# Patient Record
Sex: Female | Born: 1995 | Race: Black or African American | Hispanic: No | Marital: Single | Smoking: Never smoker
Health system: Southern US, Community
[De-identification: ages and names within clinical notes are randomized; demographics above are authoritative.]

## PROBLEM LIST (undated history)

## (undated) ENCOUNTER — Inpatient Hospital Stay (HOSPITAL_COMMUNITY): Payer: Self-pay

## (undated) DIAGNOSIS — D649 Anemia, unspecified: Secondary | ICD-10-CM

## (undated) DIAGNOSIS — N83209 Unspecified ovarian cyst, unspecified side: Secondary | ICD-10-CM

## (undated) DIAGNOSIS — I10 Essential (primary) hypertension: Secondary | ICD-10-CM

## (undated) HISTORY — PX: NO PAST SURGERIES: SHX2092

## (undated) HISTORY — DX: Essential (primary) hypertension: I10

---
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
## 2014-01-01 MED ORDER — IBUPROFEN 800 MG PO TABS: 800.0000 mg | ORAL_TABLET | Freq: Once | ORAL | Status: DC

## 2014-01-01 NOTE — ED Notes (Signed)
Patient going upstairs with SANE nurse Elnita Maxwell(Cheryl). Family going with patient. A&Ox4. No acute distress noted at this time.

## 2016-03-06 ENCOUNTER — Emergency Department (HOSPITAL_COMMUNITY): Payer: Managed Care, Other (non HMO)

## 2016-03-06 ENCOUNTER — Emergency Department (HOSPITAL_COMMUNITY)
Admission: EM | Admit: 2016-03-06 | Discharge: 2016-03-06 | Disposition: A | Discharge status: Home / Self Care | Payer: Managed Care, Other (non HMO) | Attending: Emergency Medicine | Admitting: Emergency Medicine

## 2016-03-06 ENCOUNTER — Encounter (HOSPITAL_COMMUNITY): Payer: Self-pay | Admitting: *Deleted

## 2016-03-06 DIAGNOSIS — N83201 Unspecified ovarian cyst, right side: Secondary | ICD-10-CM | POA: Insufficient documentation

## 2016-03-06 DIAGNOSIS — R102 Pelvic and perineal pain: Secondary | ICD-10-CM

## 2016-03-06 DIAGNOSIS — R1031 Right lower quadrant pain: Secondary | ICD-10-CM | POA: Diagnosis present

## 2016-03-06 HISTORY — DX: Unspecified ovarian cyst, unspecified side: N83.209

## 2016-03-06 LAB — URINALYSIS, ROUTINE W REFLEX MICROSCOPIC
Bilirubin Urine: NEGATIVE
GLUCOSE, UA: NEGATIVE mg/dL
Hgb urine dipstick: NEGATIVE
Ketones, ur: NEGATIVE mg/dL
LEUKOCYTES UA: NEGATIVE
NITRITE: NEGATIVE
Protein, ur: NEGATIVE mg/dL
SPECIFIC GRAVITY, URINE: 1.022 (ref 1.005–1.030)
pH: 8 (ref 5.0–8.0)

## 2016-03-06 LAB — PREGNANCY, URINE: Preg Test, Ur: NEGATIVE

## 2016-03-06 MED ORDER — HYDROCODONE-ACETAMINOPHEN 5-325 MG PO TABS
1.0000 | ORAL_TABLET | Freq: Four times a day (QID) | ORAL | Status: DC | PRN
Start: 2016-03-06 — End: 2016-06-27

## 2016-03-06 MED ORDER — KETOROLAC TROMETHAMINE 60 MG/2ML IM SOLN
60.0000 mg | Freq: Once | INTRAMUSCULAR | Status: AC
  Administered 2016-03-06: 60 mg via INTRAMUSCULAR
  Filled 2016-03-06: qty 2

## 2016-03-06 NOTE — ED Notes (Signed)
Per EMS, pt complains of RLQ abd pain radiating down her groin for the past 2 days . Pt has hx of ovarian cysts. Pt states the pain is worse when urinating. Pt denies n/v/d.

## 2016-03-06 NOTE — ED Notes (Signed)
Pt out of dept will obtain labs when they return

## 2016-03-06 NOTE — ED Provider Notes (Signed)
CSN: [id number]     Arrival date & time 03/06/16  1433 History   First MD Initiated Contact with Patient 03/06/16 1455     Chief Complaint  Patient presents with  . Abdominal Pain     (Consider location/radiation/quality/duration/timing/severity/associated sxs/prior Treatment) Patient is a 20 y.o. female presenting with abdominal pain. The history is provided by the patient.  Abdominal Pain Pain location:  RLQ Pain quality: cramping, sharp and shooting   Pain radiates to:  Does not radiate Pain severity:  Severe Onset quality:  Sudden Duration:  3 hours Timing:  Constant Progression:  Unchanged Chronicity:  New (states had the same pain for a short time 2 days ago that resolved and then returned today after urinating) Context: not awakening from sleep, not medication withdrawal, not recent illness, not recent sexual activity, not sick contacts and not trauma   Relieved by:  None tried Worsened by:  Movement Ineffective treatments:  None tried Associated symptoms: nausea   Associated symptoms: no anorexia, no constipation, no cough, no diarrhea, no dysuria, no fever, no vaginal bleeding, no vaginal discharge and no vomiting   Risk factors: not pregnant and no recent hospitalization     Past Medical History  Diagnosis Date  . Ovarian cyst    History reviewed. No pertinent past surgical history. No family history on file. Social History  Substance Use Topics  . Smoking status: Never Smoker   . Smokeless tobacco: None  . Alcohol Use: None   OB History    No data available     Review of Systems  Constitutional: Negative for fever.  Respiratory: Negative for cough.   Gastrointestinal: Positive for nausea and abdominal pain. Negative for vomiting, diarrhea, constipation and anorexia.  Genitourinary: Negative for dysuria, vaginal bleeding and vaginal discharge.  All other systems reviewed and are negative.     Allergies  Review of patient's allergies indicates no  known allergies.  Home Medications   Prior to Admission medications   Not on File   BP 134/56 mmHg  Pulse 86  Temp(Src) 97.6 F (36.4 C) (Oral)  Resp 18  SpO2 100%  LMP 02/19/2016 Physical Exam  Constitutional: She is oriented to person, place, and time. She appears well-developed and well-nourished. No distress.  HENT:  Head: Normocephalic and atraumatic.  Mouth/Throat: Oropharynx is clear and moist.  Eyes: Conjunctivae and EOM are normal. Pupils are equal, round, and reactive to light.  Neck: Normal range of motion. Neck supple.  Cardiovascular: Normal rate, regular rhythm and intact distal pulses.   No murmur heard. Pulmonary/Chest: Effort normal and breath sounds normal. No respiratory distress. She has no wheezes. She has no rales.  Abdominal: Soft. She exhibits no distension. There is tenderness. There is guarding. There is no rebound and no CVA tenderness. No hernia.    Musculoskeletal: Normal range of motion. She exhibits no edema or tenderness.  Neurological: She is alert and oriented to person, place, and time.  Skin: Skin is warm and dry. No rash noted. No erythema.  Psychiatric: She has a normal mood and affect. Her behavior is normal.  Nursing note and vitals reviewed.   ED Course  Procedures (including critical care time) Labs Review Labs Reviewed  URINALYSIS, ROUTINE W REFLEX MICROSCOPIC (NOT AT [facility])  PREGNANCY, URINE    Imaging Review US Transvaginal Non-ob  03/06/2016  CLINICAL DATA:  Bilateral pelvic pain. EXAM: TRANSABDOMINAL AND TRANSVAGINAL ULTRASOUND OF PELVIS DOPPLER ULTRASOUND OF OVARIES TECHNIQUE: Both transabdominal and transvaginal ultrasound examinations of the pelvis  were performed. Transabdominal technique was performed for global imaging of the pelvis including uterus, ovaries, adnexal regions, and pelvic cul-de-sac. It was necessary to proceed with endovaginal exam following the transabdominal exam to visualize the uterus and ovaries. Color  and duplex Doppler ultrasound was utilized to evaluate blood flow to the ovaries. COMPARISON:  No recent. FINDINGS: Uterus Measurements: 7.3 x 4.1 x 4.8 cm. No fibroids or other mass visualized. Endometrium Thickness: 9 mm.  No focal abnormality visualized. Right ovary Measurements: 5.7 x 4.1 x 4.4 cm. 3.4 x 2.9 x 2.9 cm complex cyst right ovary. Left ovary Measurements: 3.0 x 1.5 x 1.7 cm. Normal appearance/no adnexal mass. Pulsed Doppler evaluation of both ovaries demonstrates normal low-resistance arterial and venous waveforms. Other findings No abnormal free fluid. IMPRESSION: 1. 3.4 x 2.9 x 2.9 cm complex cyst right ovary, possible hemorrhagic cyst. Short-interval follow up ultrasound in 6-12 weeks is recommended, preferably during the week following the patient's normal menses. 2.  Exam otherwise unremarkable.  No evidence of torsion. Electronically Signed   By: Maisie Fus  Register   On: 03/06/2016 16:27   US Pelvis Complete  03/06/2016  CLINICAL DATA:  Bilateral pelvic pain. EXAM: TRANSABDOMINAL AND TRANSVAGINAL ULTRASOUND OF PELVIS DOPPLER ULTRASOUND OF OVARIES TECHNIQUE: Both transabdominal and transvaginal ultrasound examinations of the pelvis were performed. Transabdominal technique was performed for global imaging of the pelvis including uterus, ovaries, adnexal regions, and pelvic cul-de-sac. It was necessary to proceed with endovaginal exam following the transabdominal exam to visualize the uterus and ovaries. Color and duplex Doppler ultrasound was utilized to evaluate blood flow to the ovaries. COMPARISON:  No recent. FINDINGS: Uterus Measurements: 7.3 x 4.1 x 4.8 cm. No fibroids or other mass visualized. Endometrium Thickness: 9 mm.  No focal abnormality visualized. Right ovary Measurements: 5.7 x 4.1 x 4.4 cm. 3.4 x 2.9 x 2.9 cm complex cyst right ovary. Left ovary Measurements: 3.0 x 1.5 x 1.7 cm. Normal appearance/no adnexal mass. Pulsed Doppler evaluation of both ovaries demonstrates normal  low-resistance arterial and venous waveforms. Other findings No abnormal free fluid. IMPRESSION: 1. 3.4 x 2.9 x 2.9 cm complex cyst right ovary, possible hemorrhagic cyst. Short-interval follow up ultrasound in 6-12 weeks is recommended, preferably during the week following the patient's normal menses. 2.  Exam otherwise unremarkable.  No evidence of torsion. Electronically Signed   By: Maisie Fus  Register   On: 03/06/2016 16:27   [place] Art/ven Flow Abd Pelv Doppler  03/06/2016  CLINICAL DATA:  Bilateral pelvic pain. EXAM: TRANSABDOMINAL AND TRANSVAGINAL ULTRASOUND OF PELVIS DOPPLER ULTRASOUND OF OVARIES TECHNIQUE: Both transabdominal and transvaginal ultrasound examinations of the pelvis were performed. Transabdominal technique was performed for global imaging of the pelvis including uterus, ovaries, adnexal regions, and pelvic cul-de-sac. It was necessary to proceed with endovaginal exam following the transabdominal exam to visualize the uterus and ovaries. Color and duplex Doppler ultrasound was utilized to evaluate blood flow to the ovaries. COMPARISON:  No recent. FINDINGS: Uterus Measurements: 7.3 x 4.1 x 4.8 cm. No fibroids or other mass visualized. Endometrium Thickness: 9 mm.  No focal abnormality visualized. Right ovary Measurements: 5.7 x 4.1 x 4.4 cm. 3.4 x 2.9 x 2.9 cm complex cyst right ovary. Left ovary Measurements: 3.0 x 1.5 x 1.7 cm. Normal appearance/no adnexal mass. Pulsed Doppler evaluation of both ovaries demonstrates normal low-resistance arterial and venous waveforms. Other findings No abnormal free fluid. IMPRESSION: 1. 3.4 x 2.9 x 2.9 cm complex cyst right ovary, possible hemorrhagic cyst. Short-interval follow up ultrasound in  6-12 weeks is recommended, preferably during the week following the patient's normal menses. 2.  Exam otherwise unremarkable.  No evidence of torsion. Electronically Signed   By: Maisie Fushomas  Register   On: 03/06/2016 16:27   I have personally reviewed and evaluated these  images and lab results as part of my medical decision-making.   EKG Interpretation None      MDM   Final diagnoses:  Pelvic pain in female  Right ovarian cyst    Patient is a 20 year old female with right pelvic pain. She had an episode 2 days ago which resolved after taking Aleve and then developed severe pain today after urinating. She denies any changes in her urination and denies any vaginal discharge. Nausea when the pain started but no vomiting. She has a prior history of ovarian cysts when she was younger but nothing currently.  She denies any back pain.   Patient symptoms are not suggestive of appendicitis however concern for ruptured ovarian cyst versus ovarian torsion. Abdomen other than the right pelvic area is nontender. No suspicion for cholecystitis, gastritis or pancreatitis.  Vital signs are within normal limits.  Patient was taken to ultrasound prior to the pelvic exam. Ultrasound shows a complex cyst in the right ovary possibly a hemorrhagic cyst. This is most likely the cause of her symptoms. When reevaluating to do a pelvic exam patient is refusing pelvic exam at this time. She once to follow-up with OB/GYN in 6-12 weeks to confirm that cyst has resolved. Patient given Toradol here and will ensure pain is controlled before going home. Pain improved and UPT neg.  Pt d/ced home.  Gwyneth SproutWhitney Edy Belt, MD 03/06/16 [phone]

## 2016-03-06 NOTE — ED Notes (Signed)
Pelvic cart set up at bedside  

## 2016-03-06 NOTE — ED Notes (Signed)
[facility] Expected date:  Expected time:  Means of arrival:  Comments: To room 12, abd pain

## 2016-04-13 ENCOUNTER — Encounter: Payer: Self-pay | Admitting: Obstetrics & Gynecology

## 2016-04-13 ENCOUNTER — Ambulatory Visit (INDEPENDENT_AMBULATORY_CARE_PROVIDER_SITE_OTHER): Payer: Managed Care, Other (non HMO) | Admitting: Obstetrics & Gynecology

## 2016-04-13 DIAGNOSIS — N83209 Unspecified ovarian cyst, unspecified side: Secondary | ICD-10-CM | POA: Insufficient documentation

## 2016-04-13 DIAGNOSIS — N83201 Unspecified ovarian cyst, right side: Secondary | ICD-10-CM

## 2016-04-13 NOTE — H&P (Signed)
   CLINIC ENCOUNTER NOTE  History:  20 y.o. G0P0000 here today for f/u pain for right ovarian cyst. Went to [facility] ED on 03/06/2016, complex cyst in right ovary with possible hemorrhage found on [place]. Today, she denies any abnormal vaginal discharge, bleeding, pelvic pain or other concerns. LMP approx.. 03/14/2016.  Past Medical History:  Diagnosis Date  . Ovarian cyst     No past surgical history on file.  The following portions of the patient's history were reviewed and updated as appropriate: allergies, current medications, past family history, past medical history, past social history, past surgical history and problem list.   Health Maintenance:  Last pap: none.  Last mammogram: N/A.   Review of Systems:  Review of Systems  Constitutional: Negative for chills and fever.  Gastrointestinal: Positive for nausea. Negative for abdominal pain, constipation and diarrhea.  Neurological: Negative for headaches.    Objective:  Physical Exam BP 140/75 (BP Location: Right Arm, Patient Position: Sitting, Cuff Size: Large)   Pulse 97   Ht 5\' 5"  (1.651 m)   Wt 262 lb 1.6 oz (118.9 kg)   LMP 03/14/2016 (Approximate)   BMI 43.62 kg/m  CONSTITUTIONAL: Well-developed, well-nourished female in no acute distress.  HENT:  Normocephalic, atraumatic. External right and left ear normal. Oropharynx is clear and moist EYES: Conjunctivae and EOM are normal. Pupils are equal, round, and reactive to light. No scleral icterus.  NECK: Normal range of motion, supple, no masses SKIN: Skin is warm and dry. No rash noted. Not diaphoretic. No erythema. No pallor. NEUROLOGIC: Alert and oriented to person, place, and time. Normal reflexes, muscle tone coordination. No cranial nerve deficit noted. PSYCHIATRIC: Normal mood and affect. Normal behavior. Normal judgment and thought content. CARDIOVASCULAR: Normal heart rate noted RESPIRATORY: Effort and breath sounds normal, no problems with respiration noted ABDOMEN:  Soft, no distention noted.   PELVIC: Deferred MUSCULOSKELETAL: Normal range of motion. No edema noted.  Labs and Imaging No results found.  Assessment & Plan:  1. Cyst of right ovary Pain from hemorraghic cyst likely related to ovulation. Pain has resolved. Schedule f/u imaging: - US Transvaginal Non-OB; Future - US Pelvis Complete; Future   Routine preventative health maintenance measures emphasized. Please refer to After Visit Summary for other counseling recommendations.   Return if symptoms worsen or fail to improve.   Total face-to-face time with patient: 30 minutes. Over 50% of encounter was spent on counseling and coordination of care.  Jaci Lazier, Medical Student

## 2016-04-13 NOTE — Patient Instructions (Signed)
Ovarian Cyst An ovarian cyst is a fluid-filled sac that forms on an ovary. The ovaries are small organs that produce eggs in women. Various types of cysts can form on the ovaries. Most are not cancerous. Many do not cause problems, and they often go away on their own. Some may cause symptoms and require treatment. Common types of ovarian cysts include:  Functional cysts--These cysts may occur every month during the menstrual cycle. This is normal. The cysts usually go away with the next menstrual cycle if the woman does not get pregnant. Usually, there are no symptoms with a functional cyst.  Endometrioma cysts--These cysts form from the tissue that lines the uterus. They are also called "chocolate cysts" because they become filled with blood that turns brown. This type of cyst can cause pain in the lower abdomen during intercourse and with your menstrual period.  Cystadenoma cysts--This type develops from the cells on the outside of the ovary. These cysts can get very big and cause lower abdomen pain and pain with intercourse. This type of cyst can twist on itself, cut off its blood supply, and cause severe pain. It can also easily rupture and cause a lot of pain.  Dermoid cysts--This type of cyst is sometimes found in both ovaries. These cysts may contain different kinds of body tissue, such as skin, teeth, hair, or cartilage. They usually do not cause symptoms unless they get very big.  Theca lutein cysts--These cysts occur when too much of a certain hormone (human chorionic gonadotropin) is produced and overstimulates the ovaries to produce an egg. This is most common after procedures used to assist with the conception of a baby (in vitro fertilization). CAUSES   Fertility drugs can cause a condition in which multiple large cysts are formed on the ovaries. This is called ovarian hyperstimulation syndrome.  A condition called polycystic ovary syndrome can cause hormonal imbalances that can lead to  nonfunctional ovarian cysts. SIGNS AND SYMPTOMS  Many ovarian cysts do not cause symptoms. If symptoms are present, they may include:  Pelvic pain or pressure.  Pain in the lower abdomen.  Pain during sexual intercourse.  Increasing girth (swelling) of the abdomen.  Abnormal menstrual periods.  Increasing pain with menstrual periods.  Stopping having menstrual periods without being pregnant. DIAGNOSIS  These cysts are commonly found during a routine or annual pelvic exam. Tests may be ordered to find out more about the cyst. These tests may include:  Ultrasound.  X-ray of the pelvis.  CT scan.  MRI.  Blood tests. TREATMENT  Many ovarian cysts go away on their own without treatment. Your health care provider may want to check your cyst regularly for 2-3 months to see if it changes. For women in menopause, it is particularly important to monitor a cyst closely because of the higher rate of ovarian cancer in menopausal women. When treatment is needed, it may include any of the following:  A procedure to drain the cyst (aspiration). This may be done using a long needle and ultrasound. It can also be done through a laparoscopic procedure. This involves using a thin, lighted tube with a tiny camera on the end (laparoscope) inserted through a small incision.  Surgery to remove the whole cyst. This may be done using laparoscopic surgery or an open surgery involving a larger incision in the lower abdomen.  Hormone treatment or birth control pills. These methods are sometimes used to help dissolve a cyst. HOME CARE INSTRUCTIONS   Only take over-the-counter   or prescription medicines as directed by your health care provider.  Follow up with your health care provider as directed.  Get regular pelvic exams and Pap tests. SEEK MEDICAL CARE IF:   Your periods are late, irregular, or painful, or they stop.  Your pelvic pain or abdominal pain does not go away.  Your abdomen becomes  larger or swollen.  You have pressure on your bladder or trouble emptying your bladder completely.  You have pain during sexual intercourse.  You have feelings of fullness, pressure, or discomfort in your stomach.  You lose weight for no apparent reason.  You feel generally ill.  You become constipated.  You lose your appetite.  You develop acne.  You have an increase in body and facial hair.  You are gaining weight, without changing your exercise and eating habits.  You think you are pregnant. SEEK IMMEDIATE MEDICAL CARE IF:   You have increasing abdominal pain.  You feel sick to your stomach (nauseous), and you throw up (vomit).  You develop a fever that comes on suddenly.  You have abdominal pain during a bowel movement.  Your menstrual periods become heavier than usual. MAKE SURE YOU:  Understand these instructions.  Will watch your condition.  Will get help right away if you are not doing well or get worse.   This information is not intended to replace advice given to you by your health care provider. Make sure you discuss any questions you have with your health care provider.   Document Released: 09/07/2005 Document Revised: 09/12/2013 Document Reviewed: 05/15/2013 Elsevier Interactive Patient Education 2016 [employer]  

## 2016-04-14 NOTE — Progress Notes (Signed)
Patient ID: Christine Mills, female   DOB: [birth year], 20 y.o.   MRN: [id number]  Chief Complaint  Patient presents with  . Follow-up  ED visit for pain, right ovarian cyst 03/11/16  HPI Christine Mills is a 20 y.o. female.  G0P0000 Patient's last menstrual period was 03/14/2016 (approximate). She presented to [facility] 5 weeks ago with lower abdominal pain which has resolved. She uses no contraception currently sexually active. US showed right ovarian cyst possibly hemorrhagic, 6-12 week f/u was recommended HPI  Past Medical History:  Diagnosis Date  . Ovarian cyst     No past surgical history on file.  No family history on file.  Social History Social History  Substance Use Topics  . Smoking status: Never Smoker  . Smokeless tobacco: Not on file  . Alcohol use Not on file    No Known Allergies  Current Outpatient Prescriptions  Medication Sig Dispense Refill  . HYDROcodone-acetaminophen (NORCO/VICODIN) 5-325 MG tablet Take 1-2 tablets by mouth every 6 (six) hours as needed for severe pain. (Patient not taking: Reported on 04/13/2016) 10 tablet 0   No current facility-administered medications for this visit.     Review of Systems Review of Systems  Constitutional: Negative.   Gastrointestinal: Negative.   Genitourinary: Negative for dyspareunia, menstrual problem, pelvic pain and vaginal bleeding.    Blood pressure 140/75, pulse 97, height 5\' 5"  (1.651 m), weight 118.9 kg (262 lb 1.6 oz), last menstrual period 03/14/2016.  Physical Exam Physical Exam  Constitutional: She is oriented to person, place, and time. She appears well-developed. No distress.  Pulmonary/Chest: Effort normal.  Abdominal: Soft. She exhibits no mass. There is no tenderness.  Neurological: She is alert and oriented to person, place, and time.  Psychiatric: She has a normal mood and affect. Her behavior is normal.    Data Reviewed CLINICAL DATA:  Bilateral pelvic pain.  EXAM: TRANSABDOMINAL  AND TRANSVAGINAL ULTRASOUND OF PELVIS  DOPPLER ULTRASOUND OF OVARIES  TECHNIQUE: Both transabdominal and transvaginal ultrasound examinations of the pelvis were performed. Transabdominal technique was performed for global imaging of the pelvis including uterus, ovaries, adnexal regions, and pelvic cul-de-sac.  It was necessary to proceed with endovaginal exam following the transabdominal exam to visualize the uterus and ovaries. Color and duplex Doppler ultrasound was utilized to evaluate blood flow to the ovaries.  COMPARISON:  No recent.  FINDINGS: Uterus  Measurements: 7.3 x 4.1 x 4.8 cm. No fibroids or other mass visualized.  Endometrium  Thickness: 9 mm.  No focal abnormality visualized.  Right ovary  Measurements: 5.7 x 4.1 x 4.4 cm. 3.4 x 2.9 x 2.9 cm complex cyst right ovary.  Left ovary  Measurements: 3.0 x 1.5 x 1.7 cm. Normal appearance/no adnexal mass.  Pulsed Doppler evaluation of both ovaries demonstrates normal low-resistance arterial and venous waveforms.  Other findings  No abnormal free fluid.  IMPRESSION: 1. 3.4 x 2.9 x 2.9 cm complex cyst right ovary, possible hemorrhagic cyst. Short-interval follow up ultrasound in 6-12 weeks is recommended, preferably during the week following the patient's normal menses.  2.  Exam otherwise unremarkable.  No evidence of torsion.   Electronically Signed   By: Maisie Fus  Register   On: 03/06/2016 16:27  Assessment    Recent episode of pelvic pain Suspect hemorrhagic ovarian cyst, currently asymptomatic    Plan    F/U US in 3 weeks and notify her of result Recommend she consider contraceptive method       Christine Mills 04/14/2016, 9:08 AM

## 2016-04-28 ENCOUNTER — Ambulatory Visit (HOSPITAL_COMMUNITY)
Admission: RE | Admit: 2016-04-28 | Discharge: 2016-04-28 | Disposition: A | Discharge status: Home / Self Care | Payer: Managed Care, Other (non HMO) | Source: Ambulatory Visit | Attending: Obstetrics & Gynecology | Admitting: Obstetrics & Gynecology

## 2016-04-28 DIAGNOSIS — N83201 Unspecified ovarian cyst, right side: Secondary | ICD-10-CM | POA: Diagnosis present

## 2016-04-30 ENCOUNTER — Telehealth: Payer: Self-pay

## 2016-04-30 ENCOUNTER — Telehealth: Payer: Self-pay | Admitting: General Practice

## 2016-04-30 NOTE — Telephone Encounter (Signed)
Per Dr Debroah LoopArnold, patient's ultrasound is normal. Called patient & informed her of results. Patient verbalized understanding & asked when you normally ovulate. Told patient it is approximately 2 weeks after your period starts. Patient verbalized understanding & asked if she could get a cyst again. Told patient yes as they are quite common. Patient verbalized understanding & had no questions

## 2016-04-30 NOTE — Telephone Encounter (Signed)
-----   Message from Adam PhenixJames G Arnold, MD sent at 04/29/2016  9:25 AM EDT ----- Normal US no cysts seen, routine f/u

## 2016-06-25 ENCOUNTER — Encounter (HOSPITAL_COMMUNITY): Payer: Self-pay | Admitting: *Deleted

## 2016-06-25 ENCOUNTER — Emergency Department (HOSPITAL_COMMUNITY)
Admission: EM | Admit: 2016-06-25 | Discharge: 2016-06-25 | Disposition: A | Discharge status: Home / Self Care | Payer: Managed Care, Other (non HMO) | Attending: Emergency Medicine | Admitting: Emergency Medicine

## 2016-06-25 ENCOUNTER — Emergency Department (HOSPITAL_COMMUNITY): Payer: Managed Care, Other (non HMO)

## 2016-06-25 DIAGNOSIS — O0281 Inappropriate change in quantitative human chorionic gonadotropin (hCG) in early pregnancy: Secondary | ICD-10-CM | POA: Diagnosis not present

## 2016-06-25 DIAGNOSIS — Z3A01 Less than 8 weeks gestation of pregnancy: Secondary | ICD-10-CM | POA: Insufficient documentation

## 2016-06-25 DIAGNOSIS — O209 Hemorrhage in early pregnancy, unspecified: Secondary | ICD-10-CM

## 2016-06-25 LAB — CBC WITH DIFFERENTIAL/PLATELET
BASOS ABS: 0 10*3/uL (ref 0.0–0.1)
BASOS PCT: 0 %
EOS ABS: 0.2 10*3/uL (ref 0.0–0.7)
EOS PCT: 2 %
HCT: 33.7 % — ABNORMAL LOW (ref 36.0–46.0)
Hemoglobin: 10.4 g/dL — ABNORMAL LOW (ref 12.0–15.0)
LYMPHS ABS: 3.6 10*3/uL (ref 0.7–4.0)
Lymphocytes Relative: 43 %
MCH: 25.5 pg — ABNORMAL LOW (ref 26.0–34.0)
MCHC: 30.9 g/dL (ref 30.0–36.0)
MCV: 82.6 fL (ref 78.0–100.0)
MONO ABS: 0.4 10*3/uL (ref 0.1–1.0)
Monocytes Relative: 5 %
Neutro Abs: 4.3 10*3/uL (ref 1.7–7.7)
Neutrophils Relative %: 50 %
PLATELETS: 366 10*3/uL (ref 150–400)
RBC: 4.08 MIL/uL (ref 3.87–5.11)
RDW: 15.5 % (ref 11.5–15.5)
WBC: 8.5 10*3/uL (ref 4.0–10.5)

## 2016-06-25 LAB — COMPREHENSIVE METABOLIC PANEL
ALK PHOS: 64 U/L (ref 38–126)
ALT: 15 U/L (ref 14–54)
AST: 16 U/L (ref 15–41)
Albumin: 3.7 g/dL (ref 3.5–5.0)
Anion gap: 8 (ref 5–15)
BUN: 9 mg/dL (ref 6–20)
CHLORIDE: 104 mmol/L (ref 101–111)
CO2: 23 mmol/L (ref 22–32)
CREATININE: 0.59 mg/dL (ref 0.44–1.00)
Calcium: 9.5 mg/dL (ref 8.9–10.3)
GFR calc Af Amer: >60 mL/min (ref >60)
GFR calc non Af Amer: >60 mL/min (ref >60)
Glucose, Bld: 98 mg/dL (ref 65–99)
Potassium: 3.9 mmol/L (ref 3.5–5.1)
Sodium: 135 mmol/L (ref 135–145)
Total Bilirubin: 0.3 mg/dL (ref 0.3–1.2)
Total Protein: 6.9 g/dL (ref 6.5–8.1)

## 2016-06-25 LAB — URINALYSIS, ROUTINE W REFLEX MICROSCOPIC
BILIRUBIN URINE: NEGATIVE
Glucose, UA: NEGATIVE mg/dL
Ketones, ur: NEGATIVE mg/dL
Leukocytes, UA: NEGATIVE
NITRITE: NEGATIVE
Protein, ur: NEGATIVE mg/dL
SPECIFIC GRAVITY, URINE: 1.009 (ref 1.005–1.030)
pH: 7 (ref 5.0–8.0)

## 2016-06-25 LAB — TYPE AND SCREEN
ABO/RH(D): O POS
ANTIBODY SCREEN: NEGATIVE

## 2016-06-25 LAB — I-STAT BETA HCG BLOOD, ED (MC, WL, AP ONLY): HCG, QUANTITATIVE: 146.7 m[IU]/mL — AB (ref <5)

## 2016-06-25 LAB — URINE MICROSCOPIC-ADD ON: Bacteria, UA: NONE SEEN

## 2016-06-25 LAB — GC/CHLAMYDIA PROBE AMP (~~LOC~~) NOT AT ARMC
Chlamydia: NEGATIVE
Neisseria Gonorrhea: NEGATIVE

## 2016-06-25 LAB — ABO/RH: ABO/RH(D): O POS

## 2016-06-25 LAB — HIV ANTIBODY (ROUTINE TESTING W REFLEX): HIV SCREEN 4TH GENERATION: NONREACTIVE

## 2016-06-25 LAB — RPR: RPR: NONREACTIVE

## 2016-06-25 NOTE — Discharge Instructions (Signed)
Avoid exertion.  No sexual intercourse.  Take ibuprofen or Tylenol for pain.

## 2016-06-25 NOTE — ED Triage Notes (Signed)
Pt states she is pregnant, unknown how far along. Pt reports some spotting and a clot around 1400 today. Pt passed only one clot.

## 2016-06-25 NOTE — ED Provider Notes (Signed)
[facility]-EMERGENCY DEPT Provider Note   CSN: [id number] Arrival date & time: 06/25/16  0012  By signing my name below, I, Christine Mills, attest that this documentation has been prepared under the direction and in the presence of Christine BaleElliott Benicia Bergevin, MD. Electronically Signed: Angelene GiovanniEmmanuella Mills, ED Scribe. 06/25/16. 3:29 AM.    History   Chief Complaint Chief Complaint  Patient presents with  . Routine Prenatal Visit    HPI Comments: Christine Mills is a 20 y.o. female with a hx of ovarian cyst who presents to the Emergency Department requesting evaluation s/p recently finding out that she is pregnant. Pt is not sure how far along she is but reports that her LNMP was on 05/11/16. She reports associated suprapubic pain she describes as heaviness and vaginal bleeding with spotting onset 2 pm yesterday. No alleviating factors noted. Pt has not tried any medications PTA. She states that she took 2 home pregnancy tests and went to confirm with a blood test at a lab center. Pt has NKDA.  She denies any fever, nausea, vomiting, or any other complaints.   The history is provided by the patient. No language interpreter was used.    Past Medical History:  Diagnosis Date  . Ovarian cyst     Patient Active Problem List   Diagnosis Date Noted  . Ovarian cyst 04/13/2016    History reviewed. No pertinent surgical history.  OB History    Gravida Para Term Preterm AB Living   1 0 0 0 0 0   SAB TAB Ectopic Multiple Live Births   0 0 0 0 0       Home Medications    Prior to Admission medications   Medication Sig Start Date End Date Taking? Authorizing Provider  Prenatal Vit-Fe Fumarate-FA (PRENATAL MULTIVITAMIN) TABS tablet Take 1 tablet by mouth daily at 12 noon.   Yes Historical Provider, MD  HYDROcodone-acetaminophen (NORCO/VICODIN) 5-325 MG tablet Take 1-2 tablets by mouth every 6 (six) hours as needed for severe pain. Patient not taking: Reported on 06/25/2016 03/06/16   [name],  MD    Family History History reviewed. No pertinent family history.  Social History Social History  Substance Use Topics  . Smoking status: Never Smoker  . Smokeless tobacco: Never Used  . Alcohol use Yes     Allergies   Review of patient's allergies indicates no known allergies.   Review of Systems Review of Systems  Constitutional: Negative for fever.  Gastrointestinal: Positive for abdominal pain. Negative for nausea and vomiting.  Genitourinary: Positive for vaginal bleeding.  All other systems reviewed and are negative.    Physical Exam Updated Vital Signs BP (!) 137/48 (BP Location: Right Arm)   Pulse 102   Temp 98 F (36.7 C) (Oral)   Resp 18   Ht 5\' 5"  (1.651 m)   Wt 250 lb (113.4 kg)   LMP 05/18/2016   SpO2 100%   BMI 41.60 kg/m   Physical Exam  Constitutional: She is oriented to person, place, and time. She appears well-developed and well-nourished. No distress.  HENT:  Head: Normocephalic and atraumatic.  Eyes: Conjunctivae and EOM are normal.  Neck: Neck supple. No tracheal deviation present.  Cardiovascular: Normal rate.   Pulmonary/Chest: Effort normal. No respiratory distress.  Genitourinary:  Genitourinary Comments: Normal external female genitalia. Blood and vaginal wall, that appears to be coming from the cervical os. Cervix is closed. On bimanual examination there is no uterine enlargement, adnexal tenderness or palpable adnexal mass.  Musculoskeletal:  Normal range of motion.  Neurological: She is alert and oriented to person, place, and time.  Skin: Skin is warm and dry.  Psychiatric: She has a normal mood and affect. Her behavior is normal.  Nursing note and vitals reviewed.    ED Treatments / Results  DIAGNOSTIC STUDIES: Oxygen Saturation is 99% on RA, normal by my interpretation.    COORDINATION OF CARE: 3:25 AM- Pt advised of plan for treatment and pt agrees. Pt will receive pelvic examination for further evaluation.     Labs (all labs ordered are listed, but only abnormal results are displayed) Labs Reviewed  CBC WITH DIFFERENTIAL/PLATELET - Abnormal; Notable for the following:       Result Value   Hemoglobin 10.4 (*)    HCT 33.7 (*)    MCH 25.5 (*)    All other components within normal limits  URINALYSIS, ROUTINE W REFLEX MICROSCOPIC (NOT AT [facility]) - Abnormal; Notable for the following:    Hgb urine dipstick LARGE (*)    All other components within normal limits  URINE MICROSCOPIC-ADD ON - Abnormal; Notable for the following:    Squamous Epithelial / LPF 0-5 (*)    All other components within normal limits  I-STAT BETA HCG BLOOD, ED ([facility], [facility], [facility] ONLY) - Abnormal; Notable for the following:    I-stat hCG, quantitative 146.7 (*)    All other components within normal limits  COMPREHENSIVE METABOLIC PANEL  RPR  HIV ANTIBODY (ROUTINE TESTING)  TYPE AND SCREEN  ABO/RH  GC/CHLAMYDIA PROBE AMP ([place]) NOT AT [facility]    EKG  EKG Interpretation None       Radiology US Ob Comp Less 14 Wks  Result Date: 06/25/2016 CLINICAL DATA:  Bleeding and cramping during pregnancy. Estimated gestational age by LMP is 20 years 0 months. Quantitative beta HCG is 146.7. This is rising from 24 on 06/17/2017 according to the patient. EXAM: OBSTETRIC <14 WK [place] AND TRANSVAGINAL OB US TECHNIQUE: Both transabdominal and transvaginal ultrasound examinations were performed for complete evaluation of the gestation as well as the maternal uterus, adnexal regions, and pelvic cul-de-sac. Transvaginal technique was performed to assess early pregnancy. COMPARISON:  Pelvic ultrasound 04/28/2016 FINDINGS: Intrauterine gestational sac: No intrauterine gestational sac identified. Yolk sac:  Not identified. Embryo:  Not identified. Cardiac Activity: Not identified. Maternal uterus/adnexae: Uterus is anteverted. No myometrial mass lesions identified. Endometrial stripe thickness measures 10 mm. Endometrium is mildly heterogeneous. No fluid  collections are demonstrated. Both ovaries are visualized and appear normal. No abnormal adnexal mass lesions. No free fluid in the pelvis. IMPRESSION: No intrauterine gestational sac, yolk sac, or fetal pole identified. Differential considerations include intrauterine pregnancy too early to be sonographically visualized, missed abortion, or ectopic pregnancy. Followup ultrasound is recommended in 10-14 days for further evaluation. Electronically Signed   By: Burman Nieves M.D.   On: 06/25/2016 05:35   US Ob Transvaginal  Result Date: 06/25/2016 CLINICAL DATA:  Bleeding and cramping during pregnancy. Estimated gestational age by LMP is 20 years 0 months. Quantitative beta HCG is 146.7. This is rising from 24 on 06/17/2017 according to the patient. EXAM: OBSTETRIC <14 WK [place] AND TRANSVAGINAL OB US TECHNIQUE: Both transabdominal and transvaginal ultrasound examinations were performed for complete evaluation of the gestation as well as the maternal uterus, adnexal regions, and pelvic cul-de-sac. Transvaginal technique was performed to assess early pregnancy. COMPARISON:  Pelvic ultrasound 04/28/2016 FINDINGS: Intrauterine gestational sac: No intrauterine gestational sac identified. Yolk sac:  Not identified. Embryo:  Not  identified. Cardiac Activity: Not identified. Maternal uterus/adnexae: Uterus is anteverted. No myometrial mass lesions identified. Endometrial stripe thickness measures 10 mm. Endometrium is mildly heterogeneous. No fluid collections are demonstrated. Both ovaries are visualized and appear normal. No abnormal adnexal mass lesions. No free fluid in the pelvis. IMPRESSION: No intrauterine gestational sac, yolk sac, or fetal pole identified. Differential considerations include intrauterine pregnancy too early to be sonographically visualized, missed abortion, or ectopic pregnancy. Followup ultrasound is recommended in 10-14 days for further evaluation. Electronically Signed   By: Burman Nieves M.D.    On: 06/25/2016 05:35    Procedures Procedures (including critical care time)  Medications Ordered in ED Medications - No data to display   Initial Impression / Assessment and Plan / ED Course  Christine Bale, MD has reviewed the triage vital signs and the nursing notes.  Pertinent labs & imaging results that were available during my care of the patient were reviewed by me and considered in my medical decision making (see chart for details).  Clinical Course    Medications - No data to display  Patient Vitals for the past 24 hrs:  BP Temp Temp src Pulse Resp SpO2 Height Weight  06/25/16 0726 (!) 137/48 - - 102 18 100 % - -  06/25/16 0415 141/82 - - 84 - 100 % - -  06/25/16 0400 148/79 - - 94 - 100 % - -  06/25/16 0205 127/82 98 F (36.7 C) Oral 93 19 99 % - -  06/25/16 0022 143/92 99 F (37.2 C) Oral 101 18 97 % 5\' 5"  (1.651 m) 250 lb (113.4 kg)   Discussed with on-call gynecology, Dr. Emelda Fear. He agrees with discharge with outpatient follow-up for repeat quantitative hCG testing in 2 days time  At D/C Reevaluation with update and discussion. After initial assessment and treatment, an updated evaluation reveals she is comfortable. Findings discussed with the patient and all questions were answered. Katilyn Miltenberger L    Final Clinical Impressions(s) / ED Diagnoses   Final diagnoses:  Less than [redacted] weeks gestation of pregnancy  Vaginal bleeding before [redacted] weeks gestation    Early pregnancy, with very low quantitative hCG. Vaginal bleeding, blood type O positive. Suspect miscarriage. Doubt ectopic pregnancy.  Nursing Notes Reviewed/ Care Coordinated Applicable Imaging Reviewed Interpretation of Laboratory Data incorporated into ED treatment  The patient appears reasonably screened and/or stabilized for discharge and I doubt any other medical condition or other [facility] requiring further screening, evaluation, or treatment in the ED at this time prior to discharge.  Plan: Home  Medications- none; Home Treatments- rest, no sex; return here if the recommended treatment, does not improve the symptoms; Recommended follow up- MAU in 2 days for check up   New Prescriptions Discharge Medication List as of 06/25/2016  7:06 AM     I personally performed the services described in this documentation, which was scribed in my presence. The recorded information has been reviewed and is accurate.     Christine Bale, MD 06/25/16 [phone]

## 2016-06-25 NOTE — ED Notes (Signed)
Patient transported to Ultrasound 

## 2016-06-27 ENCOUNTER — Encounter (HOSPITAL_COMMUNITY): Payer: Self-pay

## 2016-06-27 ENCOUNTER — Inpatient Hospital Stay (HOSPITAL_COMMUNITY)
Admission: AD | Admit: 2016-06-27 | Discharge: 2016-06-27 | Disposition: A | Discharge status: Home / Self Care | Payer: Managed Care, Other (non HMO) | Source: Ambulatory Visit | Attending: Obstetrics and Gynecology | Admitting: Obstetrics and Gynecology

## 2016-06-27 DIAGNOSIS — Z3A01 Less than 8 weeks gestation of pregnancy: Secondary | ICD-10-CM | POA: Insufficient documentation

## 2016-06-27 DIAGNOSIS — O039 Complete or unspecified spontaneous abortion without complication: Secondary | ICD-10-CM | POA: Diagnosis present

## 2016-06-27 LAB — HCG, QUANTITATIVE, PREGNANCY: HCG, BETA CHAIN, QUANT, S: 45 m[IU]/mL — AB (ref <5)

## 2016-06-27 NOTE — MAU Provider Note (Signed)
S: 20 y.o. G1P0000 @[redacted]w[redacted]d  by LMP presents to MAU for repeat hcg.  She denies abdominal pain or vaginal bleeding today.    Her quant hcg on 10/5 was 146 and ultrasound showed No GS, YS or FP so no confirmed IUP and no ectopic was visualized.  HPI  O: BP 145/94 (BP Location: Right Arm)   Pulse 108   Temp 98.1 F (36.7 C) (Oral)   Resp 18   Ht 5\' 5"  (1.651 m)   Wt 250 lb 1.9 oz (113.5 kg)   LMP 05/18/2016   BMI 41.62 kg/m   VS reviewed, nursing note reviewed,  Constitutional: well developed, well nourished, no distress HEENT: normocephalic CV: normal rate Pulm/chest wall: normal effort Abdomen: soft Neuro: alert and oriented x 3 Skin: warm, dry Psych: affect normal  Results for orders placed or performed during the hospital encounter of 06/27/16 (from the past 24 hour(s))  hCG, quantitative, pregnancy     Status: Abnormal   Collection Time: 06/27/16  9:31 AM  Result Value Ref Range   hCG, Beta Chain, Quant, S 45 (H) <5 mIU/mL    --/--/O POS, O POS (10/05 [id number])  MDM: Ordered labs/reviewed results.  Quant hcg dropped significantly in 48 hours c/w SAB.  Discussed results with pt.  Comfort packet given by RN. [facility]  Ectopic and miscarriage precautions given and pt to return to MAU with any emergencies. Pt stable at time of discharge.  A: 1. SAB (spontaneous abortion)     P: D/C home with ectopic/bleeding precautions F/U in office in 1 week Return to MAU as needed for emergencies  Follow-up Information    [facility]   Specialty:  Obstetrics and Gynecology Why:  The clinic will call you with an appointment in approximately 1 week.  Return to MAU as needed for emergencies. Contact information: [address] [place] [id number] [phone]         Sharen CounterLEFTWICH-KIRBY, [place]on Dunwoody, CNM 2:18 PM

## 2016-06-27 NOTE — MAU Note (Signed)
Patient here for HCG, only having light bleeding, no pain.

## 2016-06-27 NOTE — Discharge Instructions (Signed)
Miscarriage  A miscarriage is the sudden loss of an unborn baby (fetus) before the 20th week of pregnancy. Most miscarriages happen in the first 3 months of pregnancy. Sometimes, it happens before a woman even knows she is pregnant. A miscarriage is also called a "spontaneous miscarriage" or "early pregnancy loss." Having a miscarriage can be an emotional experience. Talk with your caregiver about any questions you may have about miscarrying, the grieving process, and your future pregnancy plans.  CAUSES    Problems with the fetal chromosomes that make it impossible for the baby to develop normally. Problems with the baby's genes or chromosomes are most often the result of errors that occur, by chance, as the embryo divides and grows. The problems are not inherited from the parents.   Infection of the cervix or uterus.    Hormone problems.    Problems with the cervix, such as having an incompetent cervix. This is when the tissue in the cervix is not strong enough to hold the pregnancy.    Problems with the uterus, such as an abnormally shaped uterus, uterine fibroids, or congenital abnormalities.    Certain medical conditions.    Smoking, drinking alcohol, or taking illegal drugs.    Trauma.   Often, the cause of a miscarriage is unknown.   SYMPTOMS    Vaginal bleeding or spotting, with or without cramps or pain.   Pain or cramping in the abdomen or lower back.   Passing fluid, tissue, or blood clots from the vagina.  DIAGNOSIS   Your caregiver will perform a physical exam. You may also have an ultrasound to confirm the miscarriage. Blood or urine tests may also be ordered.  TREATMENT    Sometimes, treatment is not necessary if you naturally pass all the fetal tissue that was in the uterus. If some of the fetus or placenta remains in the body (incomplete miscarriage), tissue left behind may become infected and must be removed. Usually, a dilation and curettage (D and C) procedure is performed.  During a D and C procedure, the cervix is widened (dilated) and any remaining fetal or placental tissue is gently removed from the uterus.   Antibiotic medicines are prescribed if there is an infection. Other medicines may be given to reduce the size of the uterus (contract) if there is a lot of bleeding.   If you have Rh negative blood and your baby was Rh positive, you will need a Rh immunoglobulin shot. This shot will protect any future baby from having Rh blood problems in future pregnancies.  HOME CARE INSTRUCTIONS    Your caregiver may order bed rest or may allow you to continue light activity. Resume activity as directed by your caregiver.   Have someone help with home and family responsibilities during this time.    Keep track of the number of sanitary pads you use each day and how soaked (saturated) they are. Write down this information.    Do not use tampons. Do not douche or have sexual intercourse until approved by your caregiver.    Only take over-the-counter or prescription medicines for pain or discomfort as directed by your caregiver.    Do not take aspirin. Aspirin can cause bleeding.    Keep all follow-up appointments with your caregiver.    If you or your partner have problems with grieving, talk to your caregiver or seek counseling to help cope with the pregnancy loss. Allow enough time to grieve before trying to get pregnant again.     SEEK IMMEDIATE MEDICAL CARE IF:    You have severe cramps or pain in your back or abdomen.   You have a fever.   You pass large blood clots (walnut-sized or larger) ortissue from your vagina. Save any tissue for your caregiver to inspect.    Your bleeding increases.    You have a thick, bad-smelling vaginal discharge.   You become lightheaded, weak, or you faint.    You have chills.   MAKE SURE YOU:   Understand these instructions.   Will watch your condition.   Will get help right away if you are not doing well or get worse.     This  information is not intended to replace advice given to you by your health care provider. Make sure you discuss any questions you have with your health care provider.     Document Released: 03/03/2001 Document Revised: 01/02/2013 Document Reviewed: 10/27/2011  Elsevier Interactive Patient Education 2016 Elsevier Inc.

## 2016-07-21 ENCOUNTER — Encounter: Payer: Managed Care, Other (non HMO) | Admitting: Obstetrics & Gynecology

## 2017-03-21 DIAGNOSIS — I10 Essential (primary) hypertension: Secondary | ICD-10-CM

## 2017-03-21 HISTORY — DX: Essential (primary) hypertension: I10

## 2017-04-01 ENCOUNTER — Ambulatory Visit (INDEPENDENT_AMBULATORY_CARE_PROVIDER_SITE_OTHER): Payer: Managed Care, Other (non HMO)

## 2017-04-01 DIAGNOSIS — Z3201 Encounter for pregnancy test, result positive: Secondary | ICD-10-CM

## 2017-04-01 DIAGNOSIS — N912 Amenorrhea, unspecified: Secondary | ICD-10-CM

## 2017-04-01 LAB — POCT URINE PREGNANCY: Preg Test, Ur: POSITIVE — AB

## 2017-04-01 NOTE — Progress Notes (Signed)
Pt here for a pregnancy test. Pt last cycle was 02/19/2017. Positive test today. EDD 3//04/2018. Pt is 4034w6d today. Pt advised to schedule New OB appt.

## 2017-04-20 ENCOUNTER — Inpatient Hospital Stay (HOSPITAL_COMMUNITY): Payer: Managed Care, Other (non HMO)

## 2017-04-20 ENCOUNTER — Inpatient Hospital Stay (HOSPITAL_COMMUNITY)
Admission: AD | Admit: 2017-04-20 | Discharge: 2017-04-20 | Disposition: A | Discharge status: Home / Self Care | Payer: Managed Care, Other (non HMO) | Source: Ambulatory Visit | Attending: Family Medicine | Admitting: Family Medicine

## 2017-04-20 ENCOUNTER — Encounter: Payer: Self-pay | Admitting: Medical

## 2017-04-20 DIAGNOSIS — O10911 Unspecified pre-existing hypertension complicating pregnancy, first trimester: Secondary | ICD-10-CM | POA: Insufficient documentation

## 2017-04-20 DIAGNOSIS — R109 Unspecified abdominal pain: Secondary | ICD-10-CM

## 2017-04-20 DIAGNOSIS — R1030 Lower abdominal pain, unspecified: Secondary | ICD-10-CM | POA: Insufficient documentation

## 2017-04-20 DIAGNOSIS — J069 Acute upper respiratory infection, unspecified: Secondary | ICD-10-CM

## 2017-04-20 DIAGNOSIS — O10919 Unspecified pre-existing hypertension complicating pregnancy, unspecified trimester: Secondary | ICD-10-CM

## 2017-04-20 DIAGNOSIS — O26899 Other specified pregnancy related conditions, unspecified trimester: Secondary | ICD-10-CM

## 2017-04-20 DIAGNOSIS — J029 Acute pharyngitis, unspecified: Secondary | ICD-10-CM | POA: Insufficient documentation

## 2017-04-20 DIAGNOSIS — Z3A08 8 weeks gestation of pregnancy: Secondary | ICD-10-CM | POA: Diagnosis not present

## 2017-04-20 DIAGNOSIS — O99512 Diseases of the respiratory system complicating pregnancy, second trimester: Secondary | ICD-10-CM | POA: Insufficient documentation

## 2017-04-20 DIAGNOSIS — O26891 Other specified pregnancy related conditions, first trimester: Secondary | ICD-10-CM | POA: Diagnosis not present

## 2017-04-20 LAB — CBC
HCT: 31.6 % — ABNORMAL LOW (ref 36.0–46.0)
Hemoglobin: 10.3 g/dL — ABNORMAL LOW (ref 12.0–15.0)
MCH: 26.1 pg (ref 26.0–34.0)
MCHC: 32.6 g/dL (ref 30.0–36.0)
MCV: 80.2 fL (ref 78.0–100.0)
PLATELETS: 306 10*3/uL (ref 150–400)
RBC: 3.94 MIL/uL (ref 3.87–5.11)
RDW: 16 % — ABNORMAL HIGH (ref 11.5–15.5)
WBC: 9.7 10*3/uL (ref 4.0–10.5)

## 2017-04-20 LAB — URINALYSIS, ROUTINE W REFLEX MICROSCOPIC
BILIRUBIN URINE: NEGATIVE
Glucose, UA: NEGATIVE mg/dL
HGB URINE DIPSTICK: NEGATIVE
Ketones, ur: NEGATIVE mg/dL
Leukocytes, UA: NEGATIVE
Nitrite: NEGATIVE
PROTEIN: NEGATIVE mg/dL
Specific Gravity, Urine: 1.013 (ref 1.005–1.030)
pH: 6 (ref 5.0–8.0)

## 2017-04-20 LAB — WET PREP, GENITAL
Sperm: NONE SEEN
TRICH WET PREP: NONE SEEN
YEAST WET PREP: NONE SEEN

## 2017-04-20 LAB — HCG, QUANTITATIVE, PREGNANCY: hCG, Beta Chain, Quant, S: 82130 m[IU]/mL — ABNORMAL HIGH (ref <5)

## 2017-04-20 MED ORDER — LABETALOL HCL 100 MG PO TABS: 100.0000 mg | ORAL_TABLET | Freq: Two times a day (BID) | ORAL | 1 refills | Status: DC

## 2017-04-20 MED ORDER — ACETAMINOPHEN 500 MG PO TABS
1000.0000 mg | ORAL_TABLET | Freq: Once | ORAL | Status: AC
  Administered 2017-04-20: 1000 mg via ORAL
  Filled 2017-04-20: qty 2

## 2017-04-20 MED ORDER — BENZONATATE 100 MG PO CAPS: 100.0000 mg | ORAL_CAPSULE | Freq: Three times a day (TID) | ORAL | 0 refills | Status: DC

## 2017-04-20 NOTE — MAU Provider Note (Signed)
History     CSN: [id number]  Arrival date and time: 04/20/17 [id number]   First Provider Initiated Contact with Patient 04/20/17 2018      Chief Complaint  Patient presents with  . URI   HPI   Ms.Christine Mills is a 21 y.o. female G2P0010 here in MAU with abdominal pain, sore throat and URI symptoms. 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 Has a cough and says it is keeping her up at night. The abdominal pain started yesterday. The pain is off and on. The pain is located in her lower abdomen. She has not taken anything for the pain. Denies fever or vaginal bleeding.  Has an appointment with [facility] on 8/16 for new OB>   OB History    Gravida Para Term Preterm AB Living   2 0 0 0 1 0   SAB TAB Ectopic Multiple Live Births   1 0 0 0 0      Past Medical History:  Diagnosis Date  . Ovarian cyst     History reviewed. No pertinent surgical history.  History reviewed. No pertinent family history.  Social History  Substance Use Topics  . Smoking status: Never Smoker  . Smokeless tobacco: Never Used  . Alcohol use Yes    Allergies: No Known Allergies  Prescriptions Prior to Admission  Medication Sig Dispense Refill Last Dose  . Prenatal Vit-Fe Fumarate-FA (PRENATAL MULTIVITAMIN) TABS tablet Take 1 tablet by mouth daily at 12 noon.   04/20/2017 at Unknown time   Results for orders placed or performed during the hospital encounter of 04/20/17 (from the past 48 hour(s))  Urinalysis, Routine w reflex microscopic     Status: Abnormal   Collection Time: 04/20/17  7:45 PM  Result Value Ref Range   Color, Urine STRAW (A) YELLOW   APPearance CLEAR CLEAR   Specific Gravity, Urine 1.013 1.005 - 1.030   pH 6.0 5.0 - 8.0   Glucose, UA NEGATIVE NEGATIVE mg/dL   Hgb urine dipstick NEGATIVE NEGATIVE   Bilirubin Urine NEGATIVE NEGATIVE   Ketones, ur NEGATIVE NEGATIVE mg/dL   Protein, ur NEGATIVE NEGATIVE mg/dL   Nitrite NEGATIVE NEGATIVE   Leukocytes, UA NEGATIVE NEGATIVE  Wet prep, genital     Status: Abnormal   Collection Time: 04/20/17  8:43 PM  Result Value Ref Range   Yeast Wet Prep HPF POC NONE SEEN NONE SEEN   Trich, Wet Prep NONE SEEN NONE SEEN   Clue Cells Wet Prep HPF POC PRESENT (A) NONE SEEN   WBC, Wet Prep HPF POC FEW (A) NONE SEEN    Comment: MANY BACTERIA SEEN   Sperm NONE SEEN   CBC     Status: Abnormal   Collection Time: 04/20/17  8:46 PM  Result Value Ref Range   WBC 9.7 4.0 - 10.5 K/uL   RBC 3.94 3.87 - 5.11 MIL/uL   Hemoglobin 10.3 (L) 12.0 - 15.0 g/dL   HCT 11.931.6 (L) 14.736.0 - 82.946.0 %   MCV 80.2 78.0 - 100.0 fL   MCH 26.1 26.0 - 34.0 pg   MCHC 32.6 30.0 - 36.0 g/dL   RDW 56.216.0 (H) 13.011.5 - 86.515.5 %   Platelets 306 150 - 400 K/uL  HIV antibody     Status: None   Collection Time: 04/20/17  8:46 PM  Result Value Ref Range   HIV Screen 4th Generation wRfx Non Reactive Non Reactive    Comment: (NOTE) Performed At: [facility]  [facility] [address] [place], [place] [id number] Mila Homer MD NW:[id number]   hCG, quantitative, pregnancy     Status: Abnormal   Collection Time: 04/20/17  8:46 PM  Result Value Ref Range   hCG, Beta Chain, Quant, S 82,130 (H) <5 mIU/mL    Comment:          GEST. AGE      CONC.  (mIU/mL)   <=1 WEEK        5 - 50     2 WEEKS       50 - 500     3 WEEKS       100 - 10,000     4 WEEKS     1,000 - 30,000     5 WEEKS     3,500 - 115,000   6-8 WEEKS     12,000 - 270,000    12 WEEKS     15,000 - 220,000        FEMALE AND NON-PREGNANT FEMALE:     LESS THAN 5 mIU/mL    US Ob Comp Less 14 Wks  Result Date: 04/20/2017 CLINICAL DATA:  Pregnant, lower abdominal pain x2 days EXAM: OBSTETRIC <14 WK [place] AND TRANSVAGINAL OB US TECHNIQUE: Both transabdominal and transvaginal ultrasound examinations were performed for complete evaluation of the gestation as well as the maternal uterus, adnexal regions, and pelvic cul-de-sac. Transvaginal technique was performed to assess early  pregnancy. COMPARISON:  None. FINDINGS: Intrauterine gestational sac: Single Yolk sac:  Visualized. Embryo:  Visualized. Cardiac Activity: Visualized. Heart Rate: 169  bpm CRL:  17.0  mm   8 w   0 d                  [place] EDC: 11/30/2017 Subchorionic hemorrhage:  None visualized. Maternal uterus/adnexae: Bilateral ovaries are within normal limits, noting a 1.4 cm right corpus luteal cyst. No free fluid. IMPRESSION: Single live intrauterine gestation, measuring 8 weeks 0 days by crown-rump length, as above. Electronically Signed   By: Charline Bills M.D.   On: 04/20/2017 21:30   US Ob Transvaginal  Result Date: 04/20/2017 CLINICAL DATA:  Pregnant, lower abdominal pain x2 days EXAM: OBSTETRIC <14 WK [place] AND TRANSVAGINAL OB US TECHNIQUE: Both transabdominal and transvaginal ultrasound examinations were performed for complete evaluation of the gestation as well as the maternal uterus, adnexal regions, and pelvic cul-de-sac. Transvaginal technique was performed to assess early pregnancy. COMPARISON:  None. FINDINGS: Intrauterine gestational sac: Single Yolk sac:  Visualized. Embryo:  Visualized. Cardiac Activity: Visualized. Heart Rate: 169  bpm CRL:  17.0  mm   8 w   0 d                  [place] EDC: 11/30/2017 Subchorionic hemorrhage:  None visualized. Maternal uterus/adnexae: Bilateral ovaries are within normal limits, noting a 1.4 cm right corpus luteal cyst. No free fluid. IMPRESSION: Single live intrauterine gestation, measuring 8 weeks 0 days by crown-rump length, as above. Electronically Signed   By: Charline Bills M.D.   On: 04/20/2017 21:30    Review of Systems  Constitutional: Positive for fatigue. Negative for fever.  HENT: Positive for sneezing and sore throat.   Respiratory: Positive for cough.   Gastrointestinal: Positive for abdominal pain.  Genitourinary: Negative for vaginal bleeding.   Physical Exam   Blood pressure (!) 150/74, pulse 92, temperature 98.3 F (36.8 C), temperature source  Oral, resp. rate 18, height 5\' 6"  (1.676 m), weight 248  lb (112.5 kg), last menstrual period 02/19/2017, SpO2 100 %, unknown if currently breastfeeding.  Physical Exam  Constitutional: She is oriented to person, place, and time. She appears well-developed and well-nourished. No distress.  HENT:  Head: Normocephalic.  Right Ear: Tympanic membrane normal.  Left Ear: Tympanic membrane normal.  Mouth/Throat: Uvula is midline, oropharynx is clear and moist and mucous membranes are normal. No oropharyngeal exudate, posterior oropharyngeal edema or posterior oropharyngeal erythema.  Eyes: Pupils are equal, round, and reactive to light. Conjunctivae are normal.  Neck: Normal range of motion.  Cardiovascular: Normal rate.   Respiratory: Effort normal and breath sounds normal. No respiratory distress. She has no wheezes. She has no rales. She exhibits no tenderness.  GI: Soft.  Musculoskeletal: Normal range of motion.  Neurological: She is alert and oriented to person, place, and time.  Skin: Skin is warm. She is not diaphoretic.  Psychiatric: Her behavior is normal.    MAU Course  Procedures  none  MDM  Wet prep & GC HIV, CBC, Hcg, ABO US OB transvaginal   Assessment and Plan   A:  Upper respiratory virus  Abdominal pain in pregnancy, antepartum  Chronic hypertension affecting pregnancy   P:  Discharge home in stable condition Rx: Labetalol, tessalon pearls  Keep your OB appointment that is scheduled.  Return to MAU if symptoms worsen  Cool mist humidifier   Feven Alderfer, Harolyn RutherfordJennifer I, NP 04/22/2017 10:13 AM

## 2017-04-20 NOTE — MAU Note (Signed)
Pt is here with c/o cold-like symptoms, sore throat, nausea, stuffy nose, headache. Also having some abdominal pain. Denies any bleeding.

## 2017-04-20 NOTE — Discharge Instructions (Signed)
Abdominal Pain During Pregnancy Belly (abdominal) pain is common during pregnancy. Most of the time, it is not a serious problem. Other times, it can be a sign that something is wrong with the pregnancy. Always tell your doctor if you have belly pain. Follow these instructions at home: Monitor your belly pain for any changes. The following actions may help you feel better:  Do not have sex (intercourse) or put anything in your vagina until you feel better.  Rest until your pain stops.  Drink clear fluids if you feel sick to your stomach (nauseous). Do not eat solid food until you feel better.  Only take medicine as told by your doctor.  Keep all doctor visits as told.  Get help right away if:  You are bleeding, leaking fluid, or pieces of tissue come out of your vagina.  You have more pain or cramping.  You keep throwing up (vomiting).  You have pain when you pee (urinate) or have blood in your pee.  You have a fever.  You do not feel your baby moving as much.  You feel very weak or feel like passing out.  You have trouble breathing, with or without belly pain.  You have a very bad headache and belly pain.  You have fluid leaking from your vagina and belly pain.  You keep having watery poop (diarrhea).  Your belly pain does not go away after resting, or the pain gets worse. This information is not intended to replace advice given to you by your health care provider. Make sure you discuss any questions you have with your health care provider. Document Released: 08/26/2009 Document Revised: 04/15/2016 Document Reviewed: 04/06/2013 Elsevier Interactive Patient Education  2018 [employer] Mist Vaporizer A cool mist vaporizer is a device that releases a cool mist into the air. If you have a cough or a cold, using a vaporizer may help relieve your symptoms. The mist adds moisture to the air, which may help thin your mucus and make it less sticky. When your mucus is thin  and less sticky, it easier for you to breathe and to cough up secretions. Do not use a vaporizer if you are allergic to mold. Follow these instructions at home:  Follow the instructions that come with the vaporizer.  Do not use anything other than distilled water in the vaporizer.  Do not run the vaporizer all of the time. Doing that can cause mold or bacteria to grow in the vaporizer.  Clean the vaporizer after each time that you use it.  Clean and dry the vaporizer well before storing it.  Stop using the vaporizer if your breathing symptoms get worse. This information is not intended to replace advice given to you by your health care provider. Make sure you discuss any questions you have with your health care provider. Document Released: 06/04/2004 Document Revised: 03/27/2016 Document Reviewed: 12/07/2015 Elsevier Interactive Patient Education  [employer]2018 Elsevier Inc.

## 2017-04-21 LAB — HIV ANTIBODY (ROUTINE TESTING W REFLEX): HIV SCREEN 4TH GENERATION: NONREACTIVE

## 2017-04-23 LAB — GC/CHLAMYDIA PROBE AMP (~~LOC~~) NOT AT ARMC
Chlamydia: NEGATIVE
NEISSERIA GONORRHEA: NEGATIVE

## 2017-05-06 ENCOUNTER — Encounter: Payer: Self-pay | Admitting: Certified Nurse Midwife

## 2017-05-06 ENCOUNTER — Ambulatory Visit (INDEPENDENT_AMBULATORY_CARE_PROVIDER_SITE_OTHER): Payer: Managed Care, Other (non HMO) | Admitting: Certified Nurse Midwife

## 2017-05-06 VITALS — BP 145/85 | HR 105 | Wt 243.0 lb

## 2017-05-06 DIAGNOSIS — Z113 Encounter for screening for infections with a predominantly sexual mode of transmission: Secondary | ICD-10-CM

## 2017-05-06 DIAGNOSIS — O10919 Unspecified pre-existing hypertension complicating pregnancy, unspecified trimester: Secondary | ICD-10-CM | POA: Insufficient documentation

## 2017-05-06 DIAGNOSIS — O0992 Supervision of high risk pregnancy, unspecified, second trimester: Secondary | ICD-10-CM

## 2017-05-06 DIAGNOSIS — O0990 Supervision of high risk pregnancy, unspecified, unspecified trimester: Secondary | ICD-10-CM | POA: Insufficient documentation

## 2017-05-06 DIAGNOSIS — Z01419 Encounter for gynecological examination (general) (routine) without abnormal findings: Secondary | ICD-10-CM

## 2017-05-06 DIAGNOSIS — Z331 Pregnant state, incidental: Secondary | ICD-10-CM

## 2017-05-06 DIAGNOSIS — Z1389 Encounter for screening for other disorder: Secondary | ICD-10-CM

## 2017-05-06 DIAGNOSIS — O10912 Unspecified pre-existing hypertension complicating pregnancy, second trimester: Secondary | ICD-10-CM

## 2017-05-06 NOTE — Progress Notes (Signed)
Subjective:    Christine Mills is being seen today for her first obstetrical visit.  This is a planned pregnancy. She is at 9w6dgestation. Her obstetrical history is significant for obesity and CHTN. Relationship with FOB: significant other, living together. Patient does intend to breast feed. Pregnancy history fully reviewed.  The information documented in the HPI was reviewed and verified.  Menstrual History: OB History    Gravida Para Term Preterm AB Living   2 0 0 0 1 0   SAB TAB Ectopic Multiple Live Births   1 0 0 0 0       Patient's last menstrual period was 02/19/2017.    Past Medical History:  Diagnosis Date  . Hypertension 03/2017  . Ovarian cyst     History reviewed. No pertinent surgical history.   (Not in a hospital admission) No Known Allergies  Social History  Substance Use Topics  . Smoking status: Never Smoker  . Smokeless tobacco: Never Used  . Alcohol use Yes    Family History  Problem Relation Age of Onset  . Hypertension Paternal Grandmother   . Hypertension Paternal Grandfather      Review of Systems Constitutional: negative for weight loss Gastrointestinal: negative for vomiting Genitourinary:negative for genital lesions and vaginal discharge and dysuria Musculoskeletal:negative for back pain Behavioral/Psych: negative for abusive relationship, depression, illegal drug usage and tobacco use    Objective:    BP (!) 145/85   Pulse (!) 105   Wt 243 lb (110.2 kg)   LMP 02/19/2017   BMI 39.22 kg/m  General Appearance:    Alert, cooperative, no distress, appears stated age  Head:    Normocephalic, without obvious abnormality, atraumatic  Eyes:    PERRL, conjunctiva/corneas clear, EOM's intact, fundi    benign, both eyes  Ears:    Normal TM's and external ear canals, both ears  Nose:   Nares normal, septum midline, mucosa normal, no drainage    or sinus tenderness  Throat:   Lips, mucosa, and tongue normal; teeth and gums normal  Neck:    Supple, symmetrical, trachea midline, no adenopathy;    thyroid:  no enlargement/tenderness/nodules; no carotid   bruit or JVD  Back:     Symmetric, no curvature, ROM normal, no CVA tenderness  Lungs:     Clear to auscultation bilaterally, respirations unlabored  Chest Wall:    No tenderness or deformity   Heart:    Regular rate and rhythm, S1 and S2 normal, no murmur, rub   or gallop  Breast Exam:    No tenderness, masses, or nipple abnormality  Abdomen:     Soft, non-tender, bowel sounds active all four quadrants,    no masses, no organomegaly  Genitalia:    Normal female without lesion, discharge or tenderness  Extremities:   Extremities normal, atraumatic, no cyanosis or edema  Pulses:   2+ and symmetric all extremities  Skin:   Skin color, texture, turgor normal, no rashes or lesions  Lymph nodes:   Cervical, supraclavicular, and axillary nodes normal  Neurologic:   CNII-XII intact, normal strength, sensation and reflexes    throughout      Cervix:  Long, thick, closed and posterior.  FHR: 160's by doppler.  Size c/w dates.     Lab Review Urine pregnancy test Labs reviewed yes Radiologic studies reviewed yes  Assessment & Plan    Pregnancy at 138w6deeks    1. Supervision of high risk pregnancy, antepartum  POC reviewed with Dr. Constant.  - Cervicovaginal ancillary only - Cytology - PAP - Culture, OB Urine - Cystic Fibrosis Mutation 97 - Obstetric Panel, Including HIV - VITAMIN D 25 Hydroxy (Vit-D Deficiency, Fractures) - Hemoglobin A1c - Varicella zoster antibody, IgG - Hemoglobinopathy evaluation - Inheritest Society Guided - US MFM OB DETAIL +14 WK; Future - AMB MFM GENETICS REFERRAL - US MFM Fetal Nuchal Translucency; Future  2. Chronic hypertension during pregnancy, antepartum    On labetalol.   - Comp Met (CMET) - Creatinine clearance, urine, 24 hour; Future - Protein, urine, 24 hour; Future - US MFM OB DETAIL +14 WK; Future - AMB MFM GENETICS  REFERRAL - US MFM Fetal Nuchal Translucency; Future     Prenatal vitamins.  Counseling provided regarding continued use of seat belts, cessation of alcohol consumption, smoking or use of illicit drugs; infection precautions i.e., influenza/TDAP immunizations, toxoplasmosis,CMV, parvovirus, listeria and varicella; workplace safety, exercise during pregnancy; routine dental care, safe medications, sexual activity, hot tubs, saunas, pools, travel, caffeine use, fish and methlymercury, potential toxins, hair treatments, varicose veins Weight gain recommendations per IOM guidelines reviewed: underweight/BMI< 18.5--> gain 28 - 40 lbs; normal weight/BMI 18.5 - 24.9--> gain 25 - 35 lbs; overweight/BMI 25 - 29.9--> gain 15 - 25 lbs; obese/BMI >30->gain  11 - 20 lbs Problem list reviewed and updated. FIRST/CF mutation testing/NIPT/QUAD SCREEN/fragile X/Ashkenazi Jewish population testing/Spinal muscular atrophy discussed: requested. Role of ultrasound in pregnancy discussed; fetal survey: ordered. Amniocentesis discussed: not indicated.  No orders of the defined types were placed in this encounter.  Orders Placed This Encounter  Procedures  . Culture, OB Urine  . US MFM OB DETAIL +14 WK    Standing Status:   Future    Standing Expiration Date:   07/06/2018    Order Specific Question:   Reason for Exam (SYMPTOM  OR DIAGNOSIS REQUIRED)    Answer:   fetal anatomy scan, CHTN    Order Specific Question:   Preferred imaging location?    Answer:   [facility]Ul[facility]  . US MFM Fetal Nuchal Translucency    Standing Status:   Future    Standing Expiration Date:   07/06/2018    Order Specific Question:   Reason for Exam (SYMPTOM  OR DIAGNOSIS REQUIRED)    Answer:   CHTN    Order Specific Question:   Preferred imaging location?    Answer:   [facility]Ultrasound  . Cystic Fibrosis Mutation 97  . Obstetric Panel, Including HIV  . VITAMIN D 25 Hydroxy (Vit-D Deficiency, Fractures)  . Hemoglobin A1c  . Varicella  zoster antibody, IgG  . Hemoglobinopathy evaluation  . Inheritest Society Guided  . Comp Met (CMET)  . Creatinine clearance, urine, 24 hour    Standing Status:   Future    Standing Expiration Date:   05/06/2018  . Protein, urine, 24 hour    Standing Status:   Future    Standing Expiration Date:   05/06/2018  . AMB MFM GENETICS REFERRAL    Referral Priority:   Routine    Referral Type:   Consultation    Referral Reason:   Specialty Services Required    Number of Visits Requested:   1    Follow up in 4 weeks. 50% of 30 min visit spent on counseling and coordination of care.   

## 2017-05-07 LAB — CERVICOVAGINAL ANCILLARY ONLY
Bacterial vaginitis: POSITIVE — AB
CANDIDA VAGINITIS: NEGATIVE
CHLAMYDIA, DNA PROBE: NEGATIVE
Neisseria Gonorrhea: NEGATIVE
TRICH (WINDOWPATH): NEGATIVE

## 2017-05-08 LAB — URINE CULTURE, OB REFLEX

## 2017-05-08 LAB — CULTURE, OB URINE

## 2017-05-10 LAB — CYTOLOGY - PAP

## 2017-05-11 ENCOUNTER — Other Ambulatory Visit: Payer: Self-pay | Admitting: Certified Nurse Midwife

## 2017-05-11 ENCOUNTER — Telehealth: Payer: Self-pay

## 2017-05-11 DIAGNOSIS — B9689 Other specified bacterial agents as the cause of diseases classified elsewhere: Secondary | ICD-10-CM

## 2017-05-11 DIAGNOSIS — O0990 Supervision of high risk pregnancy, unspecified, unspecified trimester: Secondary | ICD-10-CM

## 2017-05-11 DIAGNOSIS — N760 Acute vaginitis: Principal | ICD-10-CM

## 2017-05-11 DIAGNOSIS — R85612 Low grade squamous intraepithelial lesion on cytologic smear of anus (LGSIL): Secondary | ICD-10-CM

## 2017-05-11 MED ORDER — METRONIDAZOLE 0.75 % VA GEL: 1.0000 | Freq: Two times a day (BID) | VAGINAL | 0 refills | Status: DC

## 2017-05-11 NOTE — Telephone Encounter (Signed)
-----   Message from Roe Coombs, CNM sent at 05/11/2017 10:36 AM EDT ----- Please also let her know that there were some changes on her pap smear, not cancer: low-grade squamous intra-epithelial lesion.  We will take a closer look after she delivers with a colposcopy.  Thank you.  R.Denney CNM

## 2017-05-11 NOTE — Telephone Encounter (Signed)
Patient notified of results and Rx and need for COLPO after delivery.

## 2017-05-12 ENCOUNTER — Telehealth: Payer: Self-pay

## 2017-05-12 ENCOUNTER — Other Ambulatory Visit: Payer: Self-pay | Admitting: Certified Nurse Midwife

## 2017-05-12 DIAGNOSIS — O0990 Supervision of high risk pregnancy, unspecified, unspecified trimester: Secondary | ICD-10-CM

## 2017-05-12 DIAGNOSIS — O9989 Other specified diseases and conditions complicating pregnancy, childbirth and the puerperium: Principal | ICD-10-CM

## 2017-05-12 DIAGNOSIS — R7989 Other specified abnormal findings of blood chemistry: Secondary | ICD-10-CM

## 2017-05-12 DIAGNOSIS — Z283 Underimmunization status: Secondary | ICD-10-CM

## 2017-05-12 DIAGNOSIS — Z2839 Other underimmunization status: Secondary | ICD-10-CM

## 2017-05-12 LAB — OBSTETRIC PANEL, INCLUDING HIV
Antibody Screen: NEGATIVE
HIV SCREEN 4TH GENERATION: NONREACTIVE
Hepatitis B Surface Ag: NEGATIVE
RPR Ser Ql: NONREACTIVE
Rh Factor: POSITIVE
Rubella Antibodies, IGG: <0.9 index — ABNORMAL LOW (ref >0.99)

## 2017-05-12 LAB — HEMOGLOBINOPATHY EVALUATION
HEMOGLOBIN F QUANTITATION: 0 % (ref 0.0–2.0)
HGB C: 0 %
HGB S: 0 %
HGB VARIANT: 0 %
Hemoglobin A2 Quantitation: 1.9 % (ref 1.8–3.2)
Hgb A: 98.1 % (ref 96.4–98.8)

## 2017-05-12 LAB — VITAMIN D 25 HYDROXY (VIT D DEFICIENCY, FRACTURES): VIT D 25 HYDROXY: 27.1 ng/mL — AB (ref 30.0–100.0)

## 2017-05-12 LAB — COMPREHENSIVE METABOLIC PANEL
A/G RATIO: 1.7 (ref 1.2–2.2)
ALBUMIN: 4.8 g/dL (ref 3.5–5.5)
ALT: 14 IU/L (ref 0–32)
AST: 15 IU/L (ref 0–40)
Alkaline Phosphatase: 57 IU/L (ref 39–117)
BILIRUBIN TOTAL: 0.2 mg/dL (ref 0.0–1.2)
BUN / CREAT RATIO: 9 (ref 9–23)
BUN: 5 mg/dL — AB (ref 6–20)
CALCIUM: 10 mg/dL (ref 8.7–10.2)
CHLORIDE: 104 mmol/L (ref 96–106)
CO2: 18 mmol/L — ABNORMAL LOW (ref 20–29)
Creatinine, Ser: 0.58 mg/dL (ref 0.57–1.00)
GFR, EST AFRICAN AMERICAN: 151 mL/min/{1.73_m2} (ref >59)
GFR, EST NON AFRICAN AMERICAN: 131 mL/min/{1.73_m2} (ref >59)
GLUCOSE: 77 mg/dL (ref 65–99)
Globulin, Total: 2.9 g/dL (ref 1.5–4.5)
Potassium: 3.7 mmol/L (ref 3.5–5.2)
Sodium: 137 mmol/L (ref 134–144)
TOTAL PROTEIN: 7.7 g/dL (ref 6.0–8.5)

## 2017-05-12 LAB — HEMOGLOBIN A1C
ESTIMATED AVERAGE GLUCOSE: 105 mg/dL
Hgb A1c MFr Bld: 5.3 % (ref 4.8–5.6)

## 2017-05-12 LAB — VARICELLA ZOSTER ANTIBODY, IGG: VARICELLA: 1411 {index} (ref >165)

## 2017-05-12 MED ORDER — VITAMIN D (ERGOCALCIFEROL) 1.25 MG (50000 UNIT) PO CAPS: 50000.0000 [IU] | ORAL_CAPSULE | ORAL | 2 refills | Status: DC

## 2017-05-12 NOTE — Telephone Encounter (Signed)
-----  Message from Morene Crocker, CNM sent at 05/12/2017  4:37 PM EDT ----- Please let her know that she is not immune to rubella and needs an MMR postpartum.  Thank you.  R.Denney CNM Please let her know that her vitamin D level is low.  Vitamin D weekly tablet has been sent to her pharmacy for her to take.  Thank you.  R.Denney CNM

## 2017-05-12 NOTE — Telephone Encounter (Signed)
Patient notified

## 2017-05-13 ENCOUNTER — Other Ambulatory Visit: Payer: Self-pay | Admitting: Certified Nurse Midwife

## 2017-05-13 LAB — CYSTIC FIBROSIS MUTATION 97: GENE DIS ANAL CARRIER INTERP BLD/T-IMP: NOT DETECTED

## 2017-05-14 ENCOUNTER — Other Ambulatory Visit: Payer: Self-pay | Admitting: Certified Nurse Midwife

## 2017-05-15 LAB — CBC WITH DIFFERENTIAL/PLATELET
BASOS: 0 %
Basophils Absolute: 0 10*3/uL (ref 0.0–0.2)
EOS (ABSOLUTE): 0.1 10*3/uL (ref 0.0–0.4)
EOS: 2 %
HEMOGLOBIN: 10.4 g/dL — AB (ref 11.1–15.9)
Hematocrit: 31.8 % — ABNORMAL LOW (ref 34.0–46.6)
IMMATURE GRANS (ABS): 0 10*3/uL (ref 0.0–0.1)
IMMATURE GRANULOCYTES: 0 %
LYMPHS: 32 %
Lymphocytes Absolute: 2.6 10*3/uL (ref 0.7–3.1)
MCH: 26.3 pg — AB (ref 26.6–33.0)
MCHC: 32.7 g/dL (ref 31.5–35.7)
MCV: 80 fL (ref 79–97)
MONOCYTES: 7 %
Monocytes Absolute: 0.6 10*3/uL (ref 0.1–0.9)
NEUTROS ABS: 4.9 10*3/uL (ref 1.4–7.0)
Neutrophils: 59 %
Platelets: 339 10*3/uL (ref 150–379)
RBC: 3.96 x10E6/uL (ref 3.77–5.28)
RDW: 16.3 % — ABNORMAL HIGH (ref 12.3–15.4)
WBC: 8.2 10*3/uL (ref 3.4–10.8)

## 2017-05-18 ENCOUNTER — Other Ambulatory Visit: Payer: Self-pay | Admitting: Certified Nurse Midwife

## 2017-05-18 ENCOUNTER — Telehealth: Payer: Self-pay

## 2017-05-18 DIAGNOSIS — D508 Other iron deficiency anemias: Secondary | ICD-10-CM

## 2017-05-18 MED ORDER — CITRANATAL BLOOM 90-1 MG PO TABS: 1.0000 | ORAL_TABLET | Freq: Every day | ORAL | 12 refills | Status: DC

## 2017-05-18 NOTE — Telephone Encounter (Signed)
Attempted to contact about results and rx, left vm. 

## 2017-05-18 NOTE — Telephone Encounter (Signed)
S/w patient and advised of results and rx sent  ?

## 2017-05-22 LAB — INHERITEST SOCIETY GUIDED

## 2017-05-25 ENCOUNTER — Other Ambulatory Visit: Payer: Self-pay | Admitting: Certified Nurse Midwife

## 2017-05-25 ENCOUNTER — Ambulatory Visit (HOSPITAL_COMMUNITY)
Admission: RE | Admit: 2017-05-25 | Discharge: 2017-05-25 | Disposition: A | Discharge status: Home / Self Care | Payer: Managed Care, Other (non HMO) | Source: Ambulatory Visit | Attending: Certified Nurse Midwife | Admitting: Certified Nurse Midwife

## 2017-05-25 ENCOUNTER — Encounter (HOSPITAL_COMMUNITY): Payer: Self-pay

## 2017-05-25 DIAGNOSIS — Z3A13 13 weeks gestation of pregnancy: Secondary | ICD-10-CM | POA: Insufficient documentation

## 2017-05-25 DIAGNOSIS — O10011 Pre-existing essential hypertension complicating pregnancy, first trimester: Secondary | ICD-10-CM | POA: Diagnosis present

## 2017-05-25 DIAGNOSIS — O10919 Unspecified pre-existing hypertension complicating pregnancy, unspecified trimester: Secondary | ICD-10-CM

## 2017-05-25 DIAGNOSIS — Z3682 Encounter for antenatal screening for nuchal translucency: Secondary | ICD-10-CM | POA: Insufficient documentation

## 2017-05-25 DIAGNOSIS — O99211 Obesity complicating pregnancy, first trimester: Secondary | ICD-10-CM | POA: Diagnosis not present

## 2017-05-25 DIAGNOSIS — O0990 Supervision of high risk pregnancy, unspecified, unspecified trimester: Secondary | ICD-10-CM

## 2017-06-01 ENCOUNTER — Other Ambulatory Visit: Payer: Self-pay

## 2017-06-03 ENCOUNTER — Ambulatory Visit (INDEPENDENT_AMBULATORY_CARE_PROVIDER_SITE_OTHER): Payer: Managed Care, Other (non HMO) | Admitting: Obstetrics and Gynecology

## 2017-06-03 VITALS — BP 133/81 | HR 102 | Wt 246.3 lb

## 2017-06-03 DIAGNOSIS — Z283 Underimmunization status: Secondary | ICD-10-CM

## 2017-06-03 DIAGNOSIS — Z23 Encounter for immunization: Secondary | ICD-10-CM

## 2017-06-03 DIAGNOSIS — O10919 Unspecified pre-existing hypertension complicating pregnancy, unspecified trimester: Secondary | ICD-10-CM

## 2017-06-03 DIAGNOSIS — O9989 Other specified diseases and conditions complicating pregnancy, childbirth and the puerperium: Secondary | ICD-10-CM

## 2017-06-03 DIAGNOSIS — Z2839 Other underimmunization status: Secondary | ICD-10-CM

## 2017-06-03 DIAGNOSIS — O0990 Supervision of high risk pregnancy, unspecified, unspecified trimester: Secondary | ICD-10-CM

## 2017-06-03 MED ORDER — ASPIRIN EC 81 MG PO TBEC
81.0000 mg | DELAYED_RELEASE_TABLET | Freq: Every day | ORAL | 5 refills | Status: DC
Start: 2017-06-03 — End: 2017-11-22

## 2017-06-03 NOTE — Progress Notes (Signed)
FLU vaccine given 06/03/17. Tolerated well.

## 2017-06-03 NOTE — Progress Notes (Signed)
   PRENATAL VISIT NOTE  Subjective:  Christine Mills is a 21 y.o. G2P0010 at 5533w6d being seen today for ongoing prenatal care.  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  Patient reports no complaints.  Contractions: Regular. Vag. Bleeding: None.  Movement: Present. Denies leaking of fluid.   The following portions of the patient's history were reviewed and updated as appropriate: allergies, current medications, past family history, past medical history, past social history, past surgical history and problem list. Problem list updated.  Objective:   Vitals:   06/03/17 1434  BP: 133/81  Pulse: (!) 102  Weight: 246 lb 4.8 oz (111.7 kg)    Fetal Status: Fetal Heart Rate (bpm): 152   Movement: Present     General:  Alert, oriented and cooperative. Patient is in no acute distress.  Skin: Skin is warm and dry. No rash noted.   Cardiovascular: Normal heart rate noted  Respiratory: Normal respiratory effort, no problems with respiration noted  Abdomen: Soft, gravid, appropriate for gestational age.  Pain/Pressure: Absent     Pelvic: Cervical exam deferred        Extremities: Normal range of motion.  Edema: None  Mental Status:  Normal mood and affect. Normal behavior. Normal judgment and thought content.   Assessment and Plan:  Pregnancy: G2P0010 at 733w6d  1. Supervision of high risk pregnancy, antepartum Patient is doing well without complaints Anatomy ultrasound already scheduled Flu vaccine today AFP next visit  2. Rubella non-immune status, antepartum Will offer next appointment  3. Chronic hypertension during pregnancy, antepartum Continue labetalol Rx ASA 81 mg  provided  General obstetric precautions including but not limited to vaginal bleeding, contractions, leaking of fluid and fetal movement were reviewed in detail with the patient. Please refer to After Visit Summary for other counseling recommendations.  Return in about 4 weeks (around 07/01/2017) for ROB.   Catalina AntiguaPeggy Jarome Trull, MD

## 2017-06-22 ENCOUNTER — Encounter: Payer: Self-pay | Admitting: *Deleted

## 2017-06-24 ENCOUNTER — Encounter (HOSPITAL_COMMUNITY): Payer: Self-pay | Admitting: Certified Nurse Midwife

## 2017-07-01 ENCOUNTER — Ambulatory Visit (INDEPENDENT_AMBULATORY_CARE_PROVIDER_SITE_OTHER): Payer: Managed Care, Other (non HMO) | Admitting: Obstetrics and Gynecology

## 2017-07-01 VITALS — BP 137/81 | HR 116 | Wt 245.0 lb

## 2017-07-01 DIAGNOSIS — O10912 Unspecified pre-existing hypertension complicating pregnancy, second trimester: Secondary | ICD-10-CM

## 2017-07-01 DIAGNOSIS — O0992 Supervision of high risk pregnancy, unspecified, second trimester: Secondary | ICD-10-CM

## 2017-07-01 DIAGNOSIS — O0990 Supervision of high risk pregnancy, unspecified, unspecified trimester: Secondary | ICD-10-CM

## 2017-07-01 DIAGNOSIS — O10919 Unspecified pre-existing hypertension complicating pregnancy, unspecified trimester: Secondary | ICD-10-CM

## 2017-07-01 NOTE — Progress Notes (Signed)
Patient reports fetal movement, denies pain. 

## 2017-07-01 NOTE — Progress Notes (Signed)
Subjective:  Christine Mills is a 21 y.o. G2P0010 at [redacted]w[redacted]d being seen today for ongoing prenatal care.  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  Patient reports no complaints.  Contractions: Not present. Vag. Bleeding: None.  Movement: Present. Denies leaking of fluid.   The following portions of the patient's history were reviewed and updated as appropriate: allergies, current medications, past family history, past medical history, past social history, past surgical history and problem list. Problem list updated.  Objective:   Vitals:   07/01/17 0927  BP: 137/81  Pulse: (!) 116  Weight: 245 lb (111.1 kg)    Fetal Status: Fetal Heart Rate (bpm): 148   Movement: Present     General:  Alert, oriented and cooperative. Patient is in no acute distress.  Skin: Skin is warm and dry. No rash noted.   Cardiovascular: Normal heart rate noted  Respiratory: Normal respiratory effort, no problems with respiration noted  Abdomen: Soft, gravid, appropriate for gestational age. Pain/Pressure: Absent     Pelvic:  Cervical exam deferred        Extremities: Normal range of motion.  Edema: Trace  Mental Status: Normal mood and affect. Normal behavior. Normal judgment and thought content.   Urinalysis:      Assessment and Plan:  Pregnancy: G2P0010 at [redacted]w[redacted]d  1. Supervision of high risk pregnancy, antepartum Stable Anatomy scan scheduled - AFP, Serum, Open Spina Bifida  2. Chronic hypertension during pregnancy, antepartum Stable Continue with BASA and Labetalol  Preterm labor symptoms and general obstetric precautions including but not limited to vaginal bleeding, contractions, leaking  of fluid and fetal movement were reviewed in detail with the patient. Please refer to After Visit Summary for other counseling recommendations.  Return in about 4 weeks (around 07/29/2017) for OB visit.   Hermina Staggers, MD

## 2017-07-02 ENCOUNTER — Ambulatory Visit (HOSPITAL_COMMUNITY)
Admission: RE | Admit: 2017-07-02 | Discharge: 2017-07-02 | Disposition: A | Discharge status: Home / Self Care | Payer: Managed Care, Other (non HMO) | Source: Ambulatory Visit | Attending: Certified Nurse Midwife | Admitting: Certified Nurse Midwife

## 2017-07-02 ENCOUNTER — Encounter (HOSPITAL_COMMUNITY): Payer: Self-pay

## 2017-07-02 ENCOUNTER — Other Ambulatory Visit: Payer: Self-pay | Admitting: Certified Nurse Midwife

## 2017-07-02 ENCOUNTER — Other Ambulatory Visit (HOSPITAL_COMMUNITY): Payer: Self-pay | Admitting: *Deleted

## 2017-07-02 DIAGNOSIS — O0990 Supervision of high risk pregnancy, unspecified, unspecified trimester: Secondary | ICD-10-CM

## 2017-07-02 DIAGNOSIS — Z3689 Encounter for other specified antenatal screening: Secondary | ICD-10-CM | POA: Diagnosis present

## 2017-07-02 DIAGNOSIS — O10912 Unspecified pre-existing hypertension complicating pregnancy, second trimester: Secondary | ICD-10-CM | POA: Diagnosis not present

## 2017-07-02 DIAGNOSIS — Z3A19 19 weeks gestation of pregnancy: Secondary | ICD-10-CM

## 2017-07-02 DIAGNOSIS — Z79899 Other long term (current) drug therapy: Secondary | ICD-10-CM | POA: Diagnosis not present

## 2017-07-02 DIAGNOSIS — O99212 Obesity complicating pregnancy, second trimester: Secondary | ICD-10-CM

## 2017-07-02 DIAGNOSIS — E669 Obesity, unspecified: Secondary | ICD-10-CM | POA: Diagnosis not present

## 2017-07-02 DIAGNOSIS — O10919 Unspecified pre-existing hypertension complicating pregnancy, unspecified trimester: Secondary | ICD-10-CM

## 2017-07-02 DIAGNOSIS — Z6839 Body mass index (BMI) 39.0-39.9, adult: Secondary | ICD-10-CM | POA: Diagnosis not present

## 2017-07-02 DIAGNOSIS — O10012 Pre-existing essential hypertension complicating pregnancy, second trimester: Secondary | ICD-10-CM | POA: Diagnosis not present

## 2017-07-02 NOTE — Consult Note (Signed)
MATERNAL FETAL MEDICINE CONSULT  Patient Name: Christine Mills Medical Record Number:  [id number] Date of Birth: 22-Aug-1996 Requesting Physician Name:  Roe Coombs, CNM Date of Service: 07/02/2017  Chief Complaint Chronic hypertension  History of Present Illness Christine Mills  is a 21 y.o. G2P0010,at [redacted]w[redacted]d with an EDD of 11/26/2017, by Last Menstrual Period dating method who was seen today at the request of Roe Coombs, CNM for chronic hypertension.  Christine Mills reports she never had elevated blood pressures until earlier in this pregnancy.  She overall feels well.  She has no headache or visual changes at this time.  She is taking labetalol as prescribed.    Review of Systems Pertinent items are noted in HPI.  Patient History OB History  Gravida Para Term Preterm AB Living  2 0 0 0 1 0  SAB TAB Ectopic Multiple Live Births  1 0 0 0 0    # Outcome Date GA Lbr Len/2nd Weight Sex Delivery Anes PTL Lv  2 Current           1 SAB 2017              Past Medical History:  Diagnosis Date  . Hypertension 03/2017  . Ovarian cyst     Past Surgical History:  Procedure Laterality Date  . NO PAST SURGERIES      Social History   Social History  . Marital status: Single    Spouse name: N/A  . Number of children: N/A  . Years of education: N/A   Social History Main Topics  . Smoking status: Never Smoker  . Smokeless tobacco: Never Used  . Alcohol use No  . Drug use: No     Comment: not currently since pregnancy  . Sexual activity: Not Currently    Partners: Male    Birth control/ protection: None   Other Topics Concern  . Not on file   Social History Narrative  . No narrative on file    Family History  Problem Relation Age of Onset  . Hypertension Paternal Grandmother   . Hypertension Paternal Grandfather    In addition, the patient has no family history of mental retardation, birth defects, or genetic diseases.  Physical Examination There were  no vitals filed for this visit. General appearance - alert, well appearing, and in no distress Mental status - alert, oriented to person, place, and time  Assessment and Recommendations 1.  Chronic Hypertension.  I discussed the range of complications that are associated with chronic hypertension including, but not limited to, fetal growth disturbance, preterm delivery, preeclampsia, and IUFD with ChristineJha.  Due to the increased risk of preeclampsia a CBC, AST, ALT, 24 hour urine collection, and serum creatinine are typically performed to determine her baseline labs and proteinuria.  She has already had labs drawn, and they were normal.  I see that a 24 hour urine collection was ordered, but has not been completed.  Christine Mills should be encourage to complete this test.  These tests should be repeated each trimester and as clinically indicated based on disease symptoms or the appearance of hypertension.  As chronic hypertension is associated with fetal growth restriction Christine Mills should have serial growth scans every 4-6 weeks after her anatomic survey at approximately 18 weeks.  Once or twice weekly fetal surveillance should also be started at 32 weeks of gestation.  I spent 15 minutes with Christine Mills today of which 50% was face-to-face counseling.   Rema Fendt, MD

## 2017-07-05 ENCOUNTER — Other Ambulatory Visit: Payer: Self-pay | Admitting: Certified Nurse Midwife

## 2017-07-05 DIAGNOSIS — O0990 Supervision of high risk pregnancy, unspecified, unspecified trimester: Secondary | ICD-10-CM

## 2017-07-06 ENCOUNTER — Other Ambulatory Visit: Payer: Self-pay

## 2017-07-06 ENCOUNTER — Telehealth: Payer: Self-pay

## 2017-07-06 DIAGNOSIS — O10919 Unspecified pre-existing hypertension complicating pregnancy, unspecified trimester: Secondary | ICD-10-CM

## 2017-07-06 MED ORDER — LABETALOL HCL 100 MG PO TABS: 100.0000 mg | ORAL_TABLET | Freq: Two times a day (BID) | ORAL | 3 refills | Status: DC

## 2017-07-06 NOTE — Telephone Encounter (Signed)
Patient needs refills for Labetalol sent to [facility].

## 2017-07-08 LAB — AFP, SERUM, OPEN SPINA BIFIDA
AFP MoM: 0.94
AFP Value: 37.1 ng/mL
GEST. AGE ON COLLECTION DATE: 18.9 wk
MATERNAL AGE AT EDD: 23 a
OSBR Risk 1 IN: 10000
TEST RESULTS AFP: NEGATIVE
WEIGHT: 245 [lb_av]

## 2017-07-29 ENCOUNTER — Ambulatory Visit (INDEPENDENT_AMBULATORY_CARE_PROVIDER_SITE_OTHER): Payer: Managed Care, Other (non HMO) | Admitting: Obstetrics and Gynecology

## 2017-07-29 VITALS — BP 130/81 | HR 101 | Wt 250.0 lb

## 2017-07-29 DIAGNOSIS — Z283 Underimmunization status: Secondary | ICD-10-CM

## 2017-07-29 DIAGNOSIS — Z2839 Other underimmunization status: Secondary | ICD-10-CM

## 2017-07-29 DIAGNOSIS — O10919 Unspecified pre-existing hypertension complicating pregnancy, unspecified trimester: Secondary | ICD-10-CM

## 2017-07-29 DIAGNOSIS — O0990 Supervision of high risk pregnancy, unspecified, unspecified trimester: Secondary | ICD-10-CM

## 2017-07-29 DIAGNOSIS — O10912 Unspecified pre-existing hypertension complicating pregnancy, second trimester: Secondary | ICD-10-CM

## 2017-07-29 DIAGNOSIS — O9989 Other specified diseases and conditions complicating pregnancy, childbirth and the puerperium: Secondary | ICD-10-CM

## 2017-07-29 DIAGNOSIS — O0992 Supervision of high risk pregnancy, unspecified, second trimester: Secondary | ICD-10-CM

## 2017-07-29 NOTE — Progress Notes (Signed)
   PRENATAL VISIT NOTE  Subjective:  Christine Mills is a 21 y.o. G2P0010 at 5212w6d being seen today for ongoing prenatal care.  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  Patient reports no complaints.  Contractions: Not present. Vag. Bleeding: None.  Movement: Present. Denies leaking of fluid.   The following portions of the patient's history were reviewed and updated as appropriate: allergies, current medications, past family history, past medical history, past social history, past surgical history and problem list. Problem list updated.  Objective:   Vitals:   07/29/17 0906  BP: 130/81  Pulse: (!) 101  Weight: 250 lb (113.4 kg)    Fetal Status: Fetal Heart Rate (bpm): 150 Fundal Height: 23 cm Movement: Present     General:  Alert, oriented and cooperative. Patient is in no acute distress.  Skin: Skin is warm and dry. No rash noted.   Cardiovascular: Normal heart rate noted  Respiratory: Normal respiratory effort, no problems with respiration noted  Abdomen: Soft, gravid, appropriate for gestational age.  Pain/Pressure: Absent     Pelvic: Cervical exam deferred        Extremities: Normal range of motion.  Edema: Trace  Mental Status:  Normal mood and affect. Normal behavior. Normal judgment and thought content.   Assessment and Plan:  Pregnancy: G2P0010 at 7112w6d  1. Supervision of high risk pregnancy, antepartum Patient is doing well without complaints Third trimester labs, glucola and tdap next visit  2. Rubella non-immune status, antepartum Will offer pp  3. Chronic hypertension during pregnancy, antepartum Normotensive Continue labetalol and ASA Follow up  anatomy and growth ultrasound tomorrow  Preterm labor symptoms and general obstetric precautions including but not limited to vaginal bleeding, contractions, leaking of fluid and fetal movement were reviewed in detail with the patient. Please refer to After Visit Summary for other counseling recommendations.  Return in about 4 weeks (around 08/26/2017) for ROB, 2 hr glucola next visit.   Catalina AntiguaPeggy Cyncere Ruhe, MD

## 2017-07-30 ENCOUNTER — Other Ambulatory Visit (HOSPITAL_COMMUNITY): Payer: Self-pay | Admitting: Maternal and Fetal Medicine

## 2017-07-30 ENCOUNTER — Ambulatory Visit (HOSPITAL_COMMUNITY)
Admission: RE | Admit: 2017-07-30 | Discharge: 2017-07-30 | Disposition: A | Discharge status: Home / Self Care | Payer: Managed Care, Other (non HMO) | Source: Ambulatory Visit | Attending: Certified Nurse Midwife | Admitting: Certified Nurse Midwife

## 2017-07-30 ENCOUNTER — Encounter (HOSPITAL_COMMUNITY): Payer: Self-pay

## 2017-07-30 ENCOUNTER — Other Ambulatory Visit (HOSPITAL_COMMUNITY): Payer: Self-pay | Admitting: *Deleted

## 2017-07-30 DIAGNOSIS — Z362 Encounter for other antenatal screening follow-up: Secondary | ICD-10-CM | POA: Insufficient documentation

## 2017-07-30 DIAGNOSIS — Z3A23 23 weeks gestation of pregnancy: Secondary | ICD-10-CM

## 2017-07-30 DIAGNOSIS — O10012 Pre-existing essential hypertension complicating pregnancy, second trimester: Secondary | ICD-10-CM | POA: Insufficient documentation

## 2017-07-30 DIAGNOSIS — O99210 Obesity complicating pregnancy, unspecified trimester: Secondary | ICD-10-CM

## 2017-07-30 DIAGNOSIS — O10919 Unspecified pre-existing hypertension complicating pregnancy, unspecified trimester: Secondary | ICD-10-CM

## 2017-07-30 DIAGNOSIS — O99212 Obesity complicating pregnancy, second trimester: Secondary | ICD-10-CM | POA: Insufficient documentation

## 2017-08-03 ENCOUNTER — Inpatient Hospital Stay (HOSPITAL_COMMUNITY)
Admission: AD | Admit: 2017-08-03 | Discharge: 2017-08-03 | Disposition: A | Discharge status: Home / Self Care | Payer: Managed Care, Other (non HMO) | Source: Ambulatory Visit | Attending: Family Medicine | Admitting: Family Medicine

## 2017-08-03 ENCOUNTER — Encounter (HOSPITAL_COMMUNITY): Payer: Self-pay | Admitting: *Deleted

## 2017-08-03 DIAGNOSIS — Z3A23 23 weeks gestation of pregnancy: Secondary | ICD-10-CM | POA: Diagnosis not present

## 2017-08-03 DIAGNOSIS — R102 Pelvic and perineal pain: Secondary | ICD-10-CM | POA: Insufficient documentation

## 2017-08-03 DIAGNOSIS — O26892 Other specified pregnancy related conditions, second trimester: Secondary | ICD-10-CM | POA: Insufficient documentation

## 2017-08-03 DIAGNOSIS — R109 Unspecified abdominal pain: Secondary | ICD-10-CM | POA: Diagnosis present

## 2017-08-03 DIAGNOSIS — O26899 Other specified pregnancy related conditions, unspecified trimester: Secondary | ICD-10-CM

## 2017-08-03 LAB — URINALYSIS, COMPLETE (UACMP) WITH MICROSCOPIC
Bilirubin Urine: NEGATIVE
GLUCOSE, UA: NEGATIVE mg/dL
HGB URINE DIPSTICK: NEGATIVE
Ketones, ur: 20 mg/dL — AB
Leukocytes, UA: NEGATIVE
NITRITE: NEGATIVE
PH: 6 (ref 5.0–8.0)
PROTEIN: NEGATIVE mg/dL
SPECIFIC GRAVITY, URINE: 1.025 (ref 1.005–1.030)

## 2017-08-03 MED ORDER — COMFORT FIT MATERNITY SUPP LG MISC: 1.0000 [IU] | Freq: Every day | 0 refills | Status: DC

## 2017-08-03 NOTE — MAU Note (Signed)
PT  SAYS  THIS AM - SHE HAD  LOWER ABD PAIN  - WENT  TO WORK .   THEN  AT 11AM - HAD SUDDEN SHARP PAIN- LOWER ABD -  CALLED HERE -  SO AT 4 PM - FELT  PRESSURE  AND LEGS  HURT.    [facility]-    FAMINA . LAST SEX- OCT

## 2017-08-03 NOTE — MAU Provider Note (Signed)
History     CSN: [id number]  Arrival date and time: 08/03/17 [id number]   First Provider Initiated Contact with Patient 08/03/17 2144      Chief Complaint  Patient presents with  . Abdominal Pain   HPI Christine Mills is a 21 y.o. G2P0010 at 1753w4d who presents with abdominal pain. Symptoms began this morning. Describes as sharp pelvic pain that radiates down her bilateral anterior thighs. Pain has occurred "a few times" today while at work. Denies vaginal bleeding, dysuria, or LOF. Positive fetal movement.   OB History    Gravida Para Term Preterm AB Living   2 0 0 0 1 0   SAB TAB Ectopic Multiple Live Births   1 0 0 0 0      Past Medical History:  Diagnosis Date  . Hypertension 03/2017  . Ovarian cyst     Past Surgical History:  Procedure Laterality Date  . NO PAST SURGERIES      Family History  Problem Relation Age of Onset  . Hypertension Paternal Grandmother   . Hypertension Paternal Grandfather     Social History   Tobacco Use  . Smoking status: Never Smoker  . Smokeless tobacco: Never Used  Substance Use Topics  . Alcohol use: No  . Drug use: No    Comment: not currently since pregnancy    Allergies: No Known Allergies  Medications Prior to Admission  Medication Sig Dispense Refill Last Dose  . aspirin EC 81 MG tablet Take 1 tablet (81 mg total) by mouth daily. Take after 12 weeks for prevention of preeclampsia later in pregnancy 30 tablet 5 08/03/2017 at Unknown time  . labetalol (NORMODYNE) 100 MG tablet Take 1 tablet (100 mg total) by mouth 2 (two) times daily. 60 tablet 3 08/03/2017 at Unknown time  . Prenatal Vit-Fe Fumarate-FA (PRENATAL MULTIVITAMIN) TABS tablet Take 1 tablet by mouth daily at 12 noon.   Past Month at Unknown time  . Prenatal-DSS-FeCb-FeGl-FA (CITRANATAL BLOOM) 90-1 MG TABS Take 1 tablet by mouth daily. 30 tablet 12 08/03/2017 at Unknown time  . Vitamin D, Ergocalciferol, (DRISDOL) 50000 units CAPS capsule Take 1 capsule (50,000  Units total) by mouth every 7 (seven) days. 30 capsule 2 Past Week at Unknown time    Review of Systems  Constitutional: Negative.   Gastrointestinal: Positive for abdominal pain.  Genitourinary: Negative.   Musculoskeletal: Negative.    Physical Exam   Blood pressure 136/74, pulse (!) 107, temperature 99.5 F (37.5 C), temperature source Oral, resp. rate 20, height 5\' 6"  (1.676 m), weight 254 lb 4 oz (115.3 kg), last menstrual period 02/19/2017, unknown if currently breastfeeding.  Physical Exam  Nursing note and vitals reviewed. Constitutional: She is oriented to person, place, and time. She appears well-developed and well-nourished. No distress.  HENT:  Head: Normocephalic and atraumatic.  Eyes: Conjunctivae are normal. Right eye exhibits no discharge. Left eye exhibits no discharge. No scleral icterus.  Neck: Normal range of motion.  Respiratory: Effort normal. No respiratory distress.  GI: Soft. There is no tenderness.  Genitourinary:  Genitourinary Comments: Dilation: Closed Effacement (%): Thick Cervical Position: Posterior Exam by:: Lyman BishopLawrence, NP   Neurological: She is alert and oriented to person, place, and time.  Skin: Skin is warm and dry. She is not diaphoretic.  Psychiatric: She has a normal mood and affect. Her behavior is normal. Judgment and thought content normal.    MAU Course  Procedures Results for orders placed or performed during the hospital encounter of  08/03/17 (from the past 24 hour(s))  Urinalysis, Complete w Microscopic     Status: Abnormal   Collection Time: 08/03/17  8:39 PM  Result Value Ref Range   Color, Urine YELLOW YELLOW   APPearance CLEAR CLEAR   Specific Gravity, Urine 1.025 1.005 - 1.030   pH 6.0 5.0 - 8.0   Glucose, UA NEGATIVE NEGATIVE mg/dL   Hgb urine dipstick NEGATIVE NEGATIVE   Bilirubin Urine NEGATIVE NEGATIVE   Ketones, ur 20 (A) NEGATIVE mg/dL   Protein, ur NEGATIVE NEGATIVE mg/dL   Nitrite NEGATIVE NEGATIVE    Leukocytes, UA NEGATIVE NEGATIVE   RBC / HPF 0-5 0 - 5 RBC/hpf   WBC, UA 0-5 0 - 5 WBC/hpf   Bacteria, UA RARE (A) NONE SEEN   Squamous Epithelial / LPF 0-5 (A) NONE SEEN   Mucus PRESENT     MDM NST:  Baseline: 145 bpm, Variability: Good {> 6 bpm), Accelerations: 10x10, Decelerations: Absent and no ctx Cervix closed/thick No ctx on toco Negative u/a  Assessment and Plan  A: 1. Pelvic pain affecting pregnancy in second trimester, antepartum   2. [redacted] weeks gestation of pregnancy   3. Pain of round ligament affecting pregnancy, antepartum    P; Discharge home Rx maternity support belt Increase water intake PTL precautions F/u with OB  Judeth HornErin Teresa Nicodemus 08/03/2017, 9:44 PM

## 2017-08-03 NOTE — Discharge Instructions (Signed)

## 2017-08-26 ENCOUNTER — Ambulatory Visit (INDEPENDENT_AMBULATORY_CARE_PROVIDER_SITE_OTHER): Payer: Managed Care, Other (non HMO) | Admitting: Obstetrics and Gynecology

## 2017-08-26 ENCOUNTER — Other Ambulatory Visit: Payer: Managed Care, Other (non HMO)

## 2017-08-26 ENCOUNTER — Encounter: Payer: Self-pay | Admitting: Obstetrics and Gynecology

## 2017-08-26 VITALS — BP 140/79 | HR 109 | Wt 251.0 lb

## 2017-08-26 DIAGNOSIS — O10912 Unspecified pre-existing hypertension complicating pregnancy, second trimester: Secondary | ICD-10-CM

## 2017-08-26 DIAGNOSIS — O0990 Supervision of high risk pregnancy, unspecified, unspecified trimester: Secondary | ICD-10-CM

## 2017-08-26 DIAGNOSIS — R7989 Other specified abnormal findings of blood chemistry: Secondary | ICD-10-CM

## 2017-08-26 DIAGNOSIS — O10919 Unspecified pre-existing hypertension complicating pregnancy, unspecified trimester: Secondary | ICD-10-CM

## 2017-08-26 DIAGNOSIS — Z3009 Encounter for other general counseling and advice on contraception: Secondary | ICD-10-CM

## 2017-08-26 DIAGNOSIS — Z23 Encounter for immunization: Secondary | ICD-10-CM | POA: Diagnosis not present

## 2017-08-26 DIAGNOSIS — R85612 Low grade squamous intraepithelial lesion on cytologic smear of anus (LGSIL): Secondary | ICD-10-CM

## 2017-08-26 DIAGNOSIS — O09899 Supervision of other high risk pregnancies, unspecified trimester: Secondary | ICD-10-CM

## 2017-08-26 DIAGNOSIS — O9989 Other specified diseases and conditions complicating pregnancy, childbirth and the puerperium: Secondary | ICD-10-CM

## 2017-08-26 DIAGNOSIS — O0992 Supervision of high risk pregnancy, unspecified, second trimester: Secondary | ICD-10-CM

## 2017-08-26 DIAGNOSIS — Z283 Underimmunization status: Secondary | ICD-10-CM

## 2017-08-26 NOTE — Progress Notes (Signed)
   PRENATAL VISIT NOTE  Subjective:  Christine Mills is a Genevive [age] y.o. G2P0010 at 729w6d being seen today for ongoing prenatal care.  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  Patient reports no complaints.  Contractions: Not present. Vag. Bleeding: None.  Movement: Present. Denies leaking of fluid.   The following portions of the patient's history were reviewed and updated as appropriate: allergies, current medications, past family history, past medical history, past social history, past surgical history and problem list. Problem list updated.  Objective:   Vitals:   08/26/17 0831  BP: 140/79  Pulse: (!) 109  Weight: 251 lb (113.9 kg)    Fetal Status: Fetal Heart Rate (bpm): 155 Fundal Height: 27 cm Movement: Present     General:  Alert, oriented and cooperative. Patient is in no acute distress.  Skin: Skin is warm and dry. No rash noted.   Cardiovascular: Normal heart rate noted  Respiratory: Normal respiratory effort, no problems with respiration noted  Abdomen: Soft, gravid, appropriate for gestational age.  Pain/Pressure: Absent     Pelvic: Cervical exam deferred        Extremities: Normal range of motion.  Edema: None  Mental Status:  Normal mood and affect. Normal behavior. Normal judgment and thought content.   Assessment and Plan:  Pregnancy: G2P0010 at 799w6d  1. Supervision of high risk pregnancy, antepartum - CBC - Glucose Tolerance, 2 Hours w/1 Hour - HIV antibody - RPR tdap today  2. Low vitamin D level - VITAMIN D 25 Hydroxy (Vit-D Deficiency, Fractures)  3. Chronic hypertension during pregnancy, antepartum Did not take her meds today, slightly  elevated Cont labetalol 100 mg BID and ASA Growth US tomorrow  5. Rubella non-immune status, antepartum Offer pp  6. Encounter for counseling regarding contraception Reviewed options for birth control including oral contraceptive pills (combination and progesterone only), NuvaRing, Depo-Provera, Nexplanon, IUDs (copper and levonorgestrol). Thoroughly reviewed risks/benefits/side effects of each. Answered all questions.   Preterm labor symptoms and general obstetric precautions including but not limited to vaginal bleeding, contractions, leaking of fluid and fetal movement were reviewed in detail with the patient. Please refer to After Visit Summary for other counseling recommendations.  Return in about 3 weeks (around 09/16/2017) for OB visit.   Conan BowensKelly M Belinda Bringhurst, MD

## 2017-08-27 ENCOUNTER — Other Ambulatory Visit (HOSPITAL_COMMUNITY): Payer: Self-pay | Admitting: *Deleted

## 2017-08-27 ENCOUNTER — Encounter (HOSPITAL_COMMUNITY): Payer: Self-pay

## 2017-08-27 ENCOUNTER — Ambulatory Visit (HOSPITAL_COMMUNITY)
Admission: RE | Admit: 2017-08-27 | Discharge: 2017-08-27 | Disposition: A | Discharge status: Home / Self Care | Payer: Managed Care, Other (non HMO) | Source: Ambulatory Visit | Attending: Certified Nurse Midwife | Admitting: Certified Nurse Midwife

## 2017-08-27 ENCOUNTER — Other Ambulatory Visit (HOSPITAL_COMMUNITY): Payer: Self-pay | Admitting: Maternal and Fetal Medicine

## 2017-08-27 DIAGNOSIS — Z3A27 27 weeks gestation of pregnancy: Secondary | ICD-10-CM

## 2017-08-27 DIAGNOSIS — O10919 Unspecified pre-existing hypertension complicating pregnancy, unspecified trimester: Secondary | ICD-10-CM

## 2017-08-27 DIAGNOSIS — O99212 Obesity complicating pregnancy, second trimester: Secondary | ICD-10-CM | POA: Diagnosis not present

## 2017-08-27 DIAGNOSIS — Z362 Encounter for other antenatal screening follow-up: Secondary | ICD-10-CM | POA: Insufficient documentation

## 2017-08-27 DIAGNOSIS — O10012 Pre-existing essential hypertension complicating pregnancy, second trimester: Secondary | ICD-10-CM | POA: Diagnosis not present

## 2017-08-27 LAB — CBC
HEMATOCRIT: 33.3 % — AB (ref 34.0–46.6)
Hemoglobin: 10.7 g/dL — ABNORMAL LOW (ref 11.1–15.9)
MCH: 27.7 pg (ref 26.6–33.0)
MCHC: 32.1 g/dL (ref 31.5–35.7)
MCV: 86 fL (ref 79–97)
Platelets: 261 10*3/uL (ref 150–379)
RBC: 3.86 x10E6/uL (ref 3.77–5.28)
RDW: 15.1 % (ref 12.3–15.4)
WBC: 7.2 10*3/uL (ref 3.4–10.8)

## 2017-08-27 LAB — GLUCOSE TOLERANCE, 2 HOURS W/ 1HR
GLUCOSE, 1 HOUR: 127 mg/dL (ref 65–179)
GLUCOSE, FASTING: 88 mg/dL (ref 65–91)
Glucose, 2 hour: 104 mg/dL (ref 65–152)

## 2017-08-27 LAB — RPR: RPR Ser Ql: NONREACTIVE

## 2017-08-27 LAB — HIV ANTIBODY (ROUTINE TESTING W REFLEX): HIV Screen 4th Generation wRfx: NONREACTIVE

## 2017-08-27 LAB — VITAMIN D 25 HYDROXY (VIT D DEFICIENCY, FRACTURES): VIT D 25 HYDROXY: 32.3 ng/mL (ref 30.0–100.0)

## 2017-09-16 ENCOUNTER — Ambulatory Visit (INDEPENDENT_AMBULATORY_CARE_PROVIDER_SITE_OTHER): Payer: Managed Care, Other (non HMO) | Admitting: Obstetrics & Gynecology

## 2017-09-16 ENCOUNTER — Other Ambulatory Visit: Payer: Self-pay

## 2017-09-16 DIAGNOSIS — O0990 Supervision of high risk pregnancy, unspecified, unspecified trimester: Secondary | ICD-10-CM

## 2017-09-16 DIAGNOSIS — O0992 Supervision of high risk pregnancy, unspecified, second trimester: Secondary | ICD-10-CM

## 2017-09-16 NOTE — Patient Instructions (Signed)

## 2017-09-16 NOTE — Progress Notes (Signed)
   PRENATAL VISIT NOTE  Subjective:  Christine Mills is a 21 y.o. G2P0010 at 2624w6d being seen today for ongoing prenatal care.  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  Patient reports no complaints.  Contractions: Not present. Vag. Bleeding: None.  Movement: Present. Denies leaking of fluid.   The following portions of the patient's history were reviewed and updated as appropriate: allergies, current medications, past family history, past medical history, past social history, past surgical history and problem list. Problem list updated.  Objective:   Vitals:   09/16/17 0916  BP: 137/74  Pulse: (!) 109  Weight: 255 lb 8 oz (115.9 kg)    Fetal Status: Fetal Heart Rate (bpm): 136   Movement: Present     General:  Alert, oriented and cooperative. Patient is in no acute distress.  Skin: Skin is warm and dry. No rash noted.   Cardiovascular: Normal heart rate noted  Respiratory: Normal respiratory effort, no problems with respiration noted  Abdomen: Soft, gravid, appropriate for gestational age.  Pain/Pressure: Absent     Pelvic: Cervical exam deferred        Extremities: Normal range of motion.  Edema: None  Mental Status:  Normal mood and affect. Normal behavior. Normal judgment and thought content.   Assessment and Plan:  Pregnancy: G2P0010 at 2224w6d  1. Supervision of high risk pregnancy, antepartum BP well controlled  Preterm labor symptoms and general obstetric precautions including but not limited to vaginal bleeding, contractions, leaking of fluid and fetal movement were reviewed in detail with the patient. Please refer to After Visit Summary for other counseling  recommendations.  Return in about 2 weeks (around 09/30/2017).   Scheryl DarterJames Tavi Gaughran, MD

## 2017-09-21 NOTE — L&D Delivery Note (Signed)
Patient is 22 y.o. G2P0010 746w2d admitted for IOL for cHTN. S/p IOL with foley bulb, cytotec, followed by Pitocin. AROM at 1548 11/20/17.  Prenatal course also complicated by obesity, rubella non-immune, and GBS neg.  Delivery Note At 5:44 AM a viable female was delivered via Vaginal, Spontaneous (Presentation: LOA).  APGAR: 9, 9; weight pending.   Placenta status: Intact,.  Cord: 3V with the following complications: None.  Cord pH: N/A  Anesthesia: Epidural  Episiotomy: None Lacerations: Vaginal tears  Suture Repair: 3.0 vicryl Est. Blood Loss (mL): 250  Mom to postpartum.  Baby to Couplet care / Skin to Skin.  Christine AdaJazma Tinnie Kunin, DO 11/21/2017, 6:03 AM

## 2017-09-24 ENCOUNTER — Encounter (HOSPITAL_COMMUNITY): Payer: Self-pay

## 2017-09-24 ENCOUNTER — Ambulatory Visit (HOSPITAL_COMMUNITY)
Admission: RE | Admit: 2017-09-24 | Discharge: 2017-09-24 | Disposition: A | Discharge status: Home / Self Care | Payer: Managed Care, Other (non HMO) | Source: Ambulatory Visit | Attending: Certified Nurse Midwife | Admitting: Certified Nurse Midwife

## 2017-09-24 ENCOUNTER — Other Ambulatory Visit (HOSPITAL_COMMUNITY): Payer: Self-pay | Admitting: Obstetrics and Gynecology

## 2017-09-24 ENCOUNTER — Other Ambulatory Visit (HOSPITAL_COMMUNITY): Payer: Self-pay | Admitting: *Deleted

## 2017-09-24 DIAGNOSIS — O99210 Obesity complicating pregnancy, unspecified trimester: Secondary | ICD-10-CM

## 2017-09-24 DIAGNOSIS — O365930 Maternal care for other known or suspected poor fetal growth, third trimester, not applicable or unspecified: Secondary | ICD-10-CM | POA: Diagnosis not present

## 2017-09-24 DIAGNOSIS — Z7982 Long term (current) use of aspirin: Secondary | ICD-10-CM | POA: Diagnosis not present

## 2017-09-24 DIAGNOSIS — Z79899 Other long term (current) drug therapy: Secondary | ICD-10-CM | POA: Diagnosis not present

## 2017-09-24 DIAGNOSIS — Z3A31 31 weeks gestation of pregnancy: Secondary | ICD-10-CM

## 2017-09-24 DIAGNOSIS — O10919 Unspecified pre-existing hypertension complicating pregnancy, unspecified trimester: Secondary | ICD-10-CM

## 2017-09-24 DIAGNOSIS — O10913 Unspecified pre-existing hypertension complicating pregnancy, third trimester: Secondary | ICD-10-CM | POA: Diagnosis not present

## 2017-09-24 DIAGNOSIS — Z363 Encounter for antenatal screening for malformations: Secondary | ICD-10-CM | POA: Diagnosis not present

## 2017-09-24 DIAGNOSIS — E669 Obesity, unspecified: Secondary | ICD-10-CM | POA: Diagnosis not present

## 2017-09-24 DIAGNOSIS — O99213 Obesity complicating pregnancy, third trimester: Secondary | ICD-10-CM | POA: Insufficient documentation

## 2017-09-30 ENCOUNTER — Encounter: Payer: Managed Care, Other (non HMO) | Admitting: Certified Nurse Midwife

## 2017-09-30 ENCOUNTER — Ambulatory Visit (INDEPENDENT_AMBULATORY_CARE_PROVIDER_SITE_OTHER): Payer: Managed Care, Other (non HMO) | Admitting: Certified Nurse Midwife

## 2017-09-30 VITALS — BP 147/75 | HR 123 | Wt 257.6 lb

## 2017-09-30 DIAGNOSIS — O09899 Supervision of other high risk pregnancies, unspecified trimester: Secondary | ICD-10-CM

## 2017-09-30 DIAGNOSIS — O10913 Unspecified pre-existing hypertension complicating pregnancy, third trimester: Secondary | ICD-10-CM | POA: Diagnosis not present

## 2017-09-30 DIAGNOSIS — O10919 Unspecified pre-existing hypertension complicating pregnancy, unspecified trimester: Secondary | ICD-10-CM

## 2017-09-30 DIAGNOSIS — O9989 Other specified diseases and conditions complicating pregnancy, childbirth and the puerperium: Secondary | ICD-10-CM

## 2017-09-30 DIAGNOSIS — O0990 Supervision of high risk pregnancy, unspecified, unspecified trimester: Secondary | ICD-10-CM

## 2017-09-30 DIAGNOSIS — Z283 Underimmunization status: Secondary | ICD-10-CM

## 2017-09-30 DIAGNOSIS — R7989 Other specified abnormal findings of blood chemistry: Secondary | ICD-10-CM

## 2017-09-30 DIAGNOSIS — O0993 Supervision of high risk pregnancy, unspecified, third trimester: Secondary | ICD-10-CM

## 2017-09-30 NOTE — Patient Instructions (Addendum)
AREA PEDIATRIC/FAMILY PRACTICE PHYSICIANS  [facility] [address] [place], [place]  [address] Phone - [phone]   Fax - [phone]  [facility] [address] [place], [place] [address] Phone - [phone]   Fax - [phone]  JACK AMOS [address] [place], [place]  [address] Phone - [phone]   Fax - [phone]  [facility] [address] [place], [place]  [address] Phone - [phone]   Fax - [phone]  [facility] [address] [place], [place]  [address] Phone - [phone]   Fax - [phone]  [facility] [address] [place], [place]  [address] Phone - [phone]   Fax - [phone]  [facility] [address] [place], [place]  [address] Phone - [phone]   Fax - [phone]  [facility] AT [facility] [address] [place], [address] [place], [place]  [address] Phone - [phone]   Fax - [phone]  [facility] AT [facility] [address] [place], [place]  [address] Phone - [phone]   Fax - [phone] [facility] [place] [address] [place], [place]  [address] Phone - [phone]   Fax - [phone]  [facility] [address] [place], [place]  [address] Phone - [phone]   Fax - [phone]  [facility] [address], [address] [place], [place]  [address] Phone - [phone]   Fax - [phone]  [facility] [place] [address] [place], [place]  [address] Phone - [phone]   Fax - [phone]  [facility] [address] [place], [place]  [address] Phone - [phone]  [facility] [address] [place], [place]  [address] Phone - [phone]   Fax - [phone]  [facility] [address] [place], [place]  [address] Phone - [phone]   Fax - [phone]  [facility] [address] [place], [place]  [address] Phone - [phone]   Fax - [phone]  [facility] [address] [place], [place]  [address] Phone - [phone]   Fax - [phone]   [facility] [address] [place], [place]  [address] Phone - [phone]   Fax - [phone]  [facility] [address] [place], [place]  [address] Phone - [phone]   Fax - [phone]  DAVID RUBIN [address] [place], [place]  [address] Phone - [phone]   Fax - [phone]  [facility] [address] [place], [place]  [address] Phone - [phone]   Fax - [phone]  [facility] [address] [place], [place]  [address] Phone - [phone]   Fax - [phone] Gerarda Fraction [address] [place], [place]  [address] Phone - [phone]   Fax - [phone]  [facility] [address] [place], [place]  [address] Phone - [phone]   Fax - [phone]  [facility] - [place] [address] [place], [place]  [address] Phone - [phone]   Fax - [phone]  [facility] [place] Wyvonne Lenz MD [address] [place] [address] Phone [phone]  Fax [phone]   Hypertension During Pregnancy Hypertension, commonly called high blood pressure, is when the force of  blood pumping through your arteries is too strong. Arteries are blood vessels that carry blood from the heart throughout the body. Hypertension during pregnancy can cause problems for you and your baby. Your baby may be born early (prematurely) or may not weigh as much as he or she should at birth. Very bad cases of hypertension during pregnancy can be life-threatening. Different types of  hypertension can occur during pregnancy. These include:  Chronic hypertension. This happens when: ? You have hypertension before pregnancy and it continues during pregnancy. ? You develop hypertension before you are [redacted] weeks pregnant, and it continues during pregnancy.  Gestational hypertension. This is hypertension that develops after the 20th week of pregnancy.  Preeclampsia, also called toxemia of pregnancy. This is a very serious type of hypertension that develops only during pregnancy. It affects the whole body, and it can be very dangerous for you and your baby.  Gestational hypertension and preeclampsia usually go away within 6 weeks after your baby is born. Women who have hypertension during pregnancy have a greater chance of developing hypertension later in life or during future pregnancies. What are the causes? The exact cause of hypertension is not known. What increases the risk? There are certain factors that make it more likely for you to develop hypertension during pregnancy. These include:  Having hypertension during a previous pregnancy or prior to pregnancy.  Being overweight.  Being older than age 29.  Being pregnant for the first time or being pregnant with more than one baby.  Becoming pregnant using fertilization methods such as IVF (in vitro fertilization).  Having diabetes, kidney problems, or systemic lupus erythematosus.  Having a family history of hypertension.  What are the signs or symptoms? Chronic hypertension and gestational hypertension rarely cause symptoms. Preeclampsia causes symptoms, which may include:  Increased protein in your urine. Your health care provider will check for this at every visit before you give birth (prenatal visit).  Severe headaches.  Sudden weight gain.  Swelling of the hands, face, legs, and feet.  Nausea and vomiting.  Vision problems, such as blurred or double vision.  Numbness in the face, arms, legs, and  feet.  Dizziness.  Slurred speech.  Sensitivity to bright lights.  Abdominal pain.  Convulsions.  How is this diagnosed? You may be diagnosed with hypertension during a routine prenatal exam. At each prenatal visit, you may:  Have a urine test to check for high amounts of protein in your urine.  Have your blood pressure checked. A blood pressure reading is recorded as two numbers, such as "120 over 80" (or 120/80). The first ("top") number is called the systolic pressure. It is a measure of the pressure in your arteries when your heart beats. The second ("bottom") number is called the diastolic pressure. It is a measure of the pressure in your arteries as your heart relaxes between beats. Blood pressure is measured in a unit called mm Hg. A normal blood pressure reading is: ? Systolic: below 120. ? Diastolic: below 80.  The type of hypertension that you are diagnosed with depends on your test results and when your symptoms developed.  Chronic hypertension is usually diagnosed before 20 weeks of pregnancy.  Gestational hypertension is usually diagnosed after 20 weeks of pregnancy.  Hypertension with high amounts of protein in the urine is diagnosed as preeclampsia.  Blood pressure measurements that stay above 160 systolic, or above 110 diastolic, are signs of severe preeclampsia.  How is this treated? Treatment for hypertension during  pregnancy varies depending on the type of hypertension you have and how serious it is.  If you take medicines called ACE inhibitors to treat chronic hypertension, you may need to switch medicines. ACE inhibitors should not be taken during pregnancy.  If you have gestational hypertension, you may need to take blood pressure medicine.  If you are at risk for preeclampsia, your health care provider may recommend that you take a low-dose aspirin every day to prevent high blood pressure during your pregnancy.  If you have severe preeclampsia, you may  need to be hospitalized so you and your baby can be monitored closely. You may also need to take medicine (magnesium sulfate) to prevent seizures and to lower blood pressure. This medicine may be given as an injection or through an IV tube.  In some cases, if your condition gets worse, you may need to deliver your baby early.  Follow these instructions at home: Eating and drinking  Drink enough fluid to keep your urine clear or pale yellow.  Eat a healthy diet that is low in salt (sodium). Do not add salt to your food. Check food labels to see how much sodium a food or beverage contains. Lifestyle  Do not use any products that contain nicotine or tobacco, such as cigarettes and e-cigarettes. If you need help quitting, ask your health care provider.  Do not use alcohol.  Avoid caffeine.  Avoid stress as much as possible. Rest and get plenty of sleep. General instructions  Take over-the-counter and prescription medicines only as told by your health care provider.  While lying down, lie on your left side. This keeps pressure off your baby.  While sitting or lying down, raise (elevate) your feet. Try putting some pillows under your lower legs.  Exercise regularly. Ask your health care provider what kinds of exercise are best for you.  Keep all prenatal and follow-up visits as told by your health care provider. This is important. Contact a health care provider if:  You have symptoms that your health care provider told you may require more treatment or monitoring, such as: ? Fever. ? Vomiting. ? Headache. Get help right away if:  You have severe abdominal pain or vomiting that does not get better with treatment.  You suddenly develop swelling in your hands, ankles, or face.  You gain 4 lbs (1.8 kg) or more in 1 week.  You develop vaginal bleeding, or you have blood in your urine.  You do not feel your baby moving as much as usual.  You have blurred or double vision.  You  have muscle twitching or sudden tightening (spasms).  You have shortness of breath.  Your lips or fingernails turn blue. This information is not intended to replace advice given to you by your health care provider. Make sure you discuss any questions you have with your health care provider. Document Released: 05/26/2011 Document Revised: 03/27/2016 Document Reviewed: 02/21/2016 Elsevier Interactive Patient Education  2018 [employer].  Before Baby Comes Home Before your baby arrives it is important to:  Have all of the supplies that you will need to care for your baby.  Know where to go if there is an emergency.  Discuss the baby's arrival with other family members.  What supplies will I need?  It is recommended that you have the following supplies: Large Items  Crib.  Crib mattress.  Rear-facing infant car seat. If possible, have a trained professional check to make sure that it is installed correctly.  Feeding  6-8 bottles that are 4-5 oz in size.  6-8 nipples.  Bottle brush.  Sterilizer, or a large pan or kettle with a lid.  A way to boil and cool water.  If you will be breastfeeding: ? Breast pump. ? Nipple cream. ? Nursing bra. ? Breast pads. ? Breast shields.  If you will be formula feeding: ? Formula. ? Measuring cups. ? Measuring spoons.  Bathing  Mild baby soap and baby shampoo.  Petroleum jelly.  Soft cloth towel and washcloth.  Hooded towel.  Cotton balls.  Bath basin.  Other Supplies  Rectal thermometer.  Bulb syringe.  Baby wipes or washcloths for diaper changes.  Diaper bag.  Changing pad.  Clothing, including one-piece outfits and pajamas.  Baby nail clippers.  Receiving blankets.  Mattress pad and sheets for the crib.  Night-light for the baby's room.  Baby monitor.  2 or 3 pacifiers.  Either 24-36 cloth diapers and waterproof diaper covers or a box of disposable diapers. You may need to use as many as  10-12 diapers per day.  How do I prepare for an emergency? Prepare for an emergency by:  Knowing how to get to the nearest hospital.  Listing the phone numbers of your baby's health care providers near your home phone and in your cell phone.  How do I prepare my family?  Decide how to handle visitors.  If you have other children: ? Talk with them about the baby coming home. Ask them how they feel about it. ? Read a book together about being a new big brother or sister. ? Find ways to let them help you prepare for the new baby. ? Have someone ready to care for them while you are in the hospital. This information is not intended to replace advice given to you by your health care provider. Make sure you discuss any questions you have with your health care provider. Document Released: 08/20/2008 Document Revised: 02/13/2016 Document Reviewed: 08/15/2014 Elsevier Interactive Patient Education  2018 [employer].  Preeclampsia and Eclampsia Preeclampsia is a serious condition that develops only during pregnancy. It is also called toxemia of pregnancy. This condition causes high blood pressure along with other symptoms, such as swelling and headaches. These symptoms may develop as the condition gets worse. Preeclampsia may occur at 20 weeks of pregnancy or later. Diagnosing and treating preeclampsia early is very important. If not treated early, it can cause serious problems for you and your baby. One problem it can lead to is eclampsia, which is a condition that causes muscle jerking or shaking (convulsions or seizures) in the mother. Delivering your baby is the best treatment for preeclampsia or eclampsia. Preeclampsia and eclampsia symptoms usually go away after your baby is born. What are the causes? The cause of preeclampsia is not known. What increases the risk? The following risk factors make you more likely to develop preeclampsia:  Being pregnant for the first time.  Having had  preeclampsia during a past pregnancy.  Having a family history of preeclampsia.  Having high blood pressure.  Being pregnant with twins or triplets.  Being 58 or older.  Being African-American.  Having kidney disease or diabetes.  Having medical conditions such as lupus or blood diseases.  Being very overweight (obese).  What are the signs or symptoms? The earliest signs of preeclampsia are:  High blood pressure.  Increased protein in your urine. Your health care provider will check for this at every visit before you give birth (  prenatal visit).  Other symptoms that may develop as the condition gets worse include:  Severe headaches.  Sudden weight gain.  Swelling of the hands, face, legs, and feet.  Nausea and vomiting.  Vision problems, such as blurred or double vision.  Numbness in the face, arms, legs, and feet.  Urinating less than usual.  Dizziness.  Slurred speech.  Abdominal pain, especially upper abdominal pain.  Convulsions or seizures.  Symptoms generally go away after giving birth. How is this diagnosed? There are no screening tests for preeclampsia. Your health care provider will ask you about symptoms and check for signs of preeclampsia during your prenatal visits. You may also have tests that include:  Urine tests.  Blood tests.  Checking your blood pressure.  Monitoring your baby's heart rate.  Ultrasound.  How is this treated? You and your health care provider will determine the treatment approach that is best for you. Treatment may include:  Having more frequent prenatal exams to check for signs of preeclampsia, if you have an increased risk for preeclampsia.  Bed rest.  Reducing how much salt (sodium) you eat.  Medicine to lower your blood pressure.  Staying in the hospital, if your condition is severe. There, treatment will focus on controlling your blood pressure and the amount of fluids in your body (fluid  retention).  You may need to take medicine (magnesium sulfate) to prevent seizures. This medicine may be given as an injection or through an IV tube.  Delivering your baby early, if your condition gets worse. You may have your labor started with medicine (induced), or you may have a cesarean delivery.  Follow these instructions at home: Eating and drinking   Drink enough fluid to keep your urine clear or pale yellow.  Eat a healthy diet that is low in sodium. Do not add salt to your food. Check nutrition labels to see how much sodium a food or beverage contains.  Avoid caffeine. Lifestyle  Do not use any products that contain nicotine or tobacco, such as cigarettes and e-cigarettes. If you need help quitting, ask your health care provider.  Do not use alcohol or drugs.  Avoid stress as much as possible. Rest and get plenty of sleep. General instructions  Take over-the-counter and prescription medicines only as told by your health care provider.  When lying down, lie on your side. This keeps pressure off of your baby.  When sitting or lying down, raise (elevate) your feet. Try putting some pillows underneath your lower legs.  Exercise regularly. Ask your health care provider what kinds of exercise are best for you.  Keep all follow-up and prenatal visits as told by your health care provider. This is important. How is this prevented? To prevent preeclampsia or eclampsia from developing during another pregnancy:  Get proper medical care during pregnancy. Your health care provider may be able to prevent preeclampsia or diagnose and treat it early.  Your health care provider may have you take a low-dose aspirin or a calcium supplement during your next pregnancy.  You may have tests of your blood pressure and kidney function after giving birth.  Maintain a healthy weight. Ask your health care provider for help managing weight gain during pregnancy.  Work with your health care  provider to manage any long-term (chronic) health conditions you have, such as diabetes or kidney problems.  Contact a health care provider if:  You gain more weight than expected.  You have headaches.  You have nausea or vomiting.  You have abdominal pain.  You feel dizzy or light-headed. Get help right away if:  You develop sudden or severe swelling anywhere in your body. This usually happens in the legs.  You gain 5 lbs (2.3 kg) or more during one week.  You have severe: ? Abdominal pain. ? Headaches. ? Dizziness. ? Vision problems. ? Confusion. ? Nausea or vomiting.  You have a seizure.  You have trouble moving any part of your body.  You develop numbness in any part of your body.  You have trouble speaking.  You have any abnormal bleeding.  You pass out. This information is not intended to replace advice given to you by your health care provider. Make sure you discuss any questions you have with your health care provider. Document Released: 09/04/2000 Document Revised: 05/05/2016 Document Reviewed: 04/13/2016 Elsevier Interactive Patient Education  2018 [employer].  Third Trimester of Pregnancy The third trimester is from week 28 through week 40 (months 7 through 9). The third trimester is a time when the unborn baby (fetus) is growing rapidly. At the end of the ninth month, the fetus is about 20 inches in length and weighs 6-10 pounds. Body changes during your third trimester Your body will continue to go through many changes during pregnancy. The changes vary from woman to woman. During the third trimester:  Your weight will continue to increase. You can expect to gain 25-35 pounds (11-16 kg) by the end of the pregnancy.  You may begin to get stretch marks on your hips, abdomen, and breasts.  You may urinate more often because the fetus is moving lower into your pelvis and pressing on your bladder.  You may develop or continue to have heartburn. This is  caused by increased hormones that slow down muscles in the digestive tract.  You may develop or continue to have constipation because increased hormones slow digestion and cause the muscles that push waste through your intestines to relax.  You may develop hemorrhoids. These are swollen veins (varicose veins) in the rectum that can itch or be painful.  You may develop swollen, bulging veins (varicose veins) in your legs.  You may have increased body aches in the pelvis, back, or thighs. This is due to weight gain and increased hormones that are relaxing your joints.  You may have changes in your hair. These can include thickening of your hair, rapid growth, and changes in texture. Some women also have hair loss during or after pregnancy, or hair that feels dry or thin. Your hair will most likely return to normal after your baby is born.  Your breasts will continue to grow and they will continue to become tender. A yellow fluid (colostrum) may leak from your breasts. This is the first milk you are producing for your baby.  Your belly button may stick out.  You may notice more swelling in your hands, face, or ankles.  You may have increased tingling or numbness in your hands, arms, and legs. The skin on your belly may also feel numb.  You may feel short of breath because of your expanding uterus.  You may have more problems sleeping. This can be caused by the size of your belly, increased need to urinate, and an increase in your body's metabolism.  You may notice the fetus "dropping," or moving lower in your abdomen (lightening).  You may have increased vaginal discharge.  You may notice your joints feel loose and you may have pain around your pelvic bone.  What to expect at prenatal visits You will have prenatal exams every 2 weeks until week 36. Then you will have weekly prenatal exams. During a routine prenatal visit:  You will be weighed to make sure you and the baby are growing  normally.  Your blood pressure will be taken.  Your abdomen will be measured to track your baby's growth.  The fetal heartbeat will be listened to.  Any test results from the previous visit will be discussed.  You may have a cervical check near your due date to see if your cervix has softened or thinned (effaced).  You will be tested for Group B streptococcus. This happens between 35 and 37 weeks.  Your health care provider may ask you:  What your birth plan is.  How you are feeling.  If you are feeling the baby move.  If you have had any abnormal symptoms, such as leaking fluid, bleeding, severe headaches, or abdominal cramping.  If you are using any tobacco products, including cigarettes, chewing tobacco, and electronic cigarettes.  If you have any questions.  Other tests or screenings that may be performed during your third trimester include:  Blood tests that check for low iron levels (anemia).  Fetal testing to check the health, activity level, and growth of the fetus. Testing is done if you have certain medical conditions or if there are problems during the pregnancy.  Nonstress test (NST). This test checks the health of your baby to make sure there are no signs of problems, such as the baby not getting enough oxygen. During this test, a belt is placed around your belly. The baby is made to move, and its heart rate is monitored during movement.  What is false labor? False labor is a condition in which you feel small, irregular tightenings of the muscles in the womb (contractions) that usually go away with rest, changing position, or drinking water. These are called Braxton Hicks contractions. Contractions may last for hours, days, or even weeks before true labor sets in. If contractions come at regular intervals, become more frequent, increase in intensity, or become painful, you should see your health care provider. What are the signs of labor?  Abdominal  cramps.  Regular contractions that start at 10 minutes apart and become stronger and more frequent with time.  Contractions that start on the top of the uterus and spread down to the lower abdomen and back.  Increased pelvic pressure and dull back pain.  A watery or bloody mucus discharge that comes from the vagina.  Leaking of amniotic fluid. This is also known as your "water breaking." It could be a slow trickle or a gush. Let your health care provider know if it has a color or strange odor. If you have any of these signs, call your health care provider right away, even if it is before your due date. Follow these instructions at home: Medicines  Follow your health care provider's instructions regarding medicine use. Specific medicines may be either safe or unsafe to take during pregnancy.  Take a prenatal vitamin that contains at least 600 micrograms (mcg) of folic acid.  If you develop constipation, try taking a stool softener if your health care provider approves. Eating and drinking  Eat a balanced diet that includes fresh fruits and vegetables, whole grains, good sources of protein such as meat, eggs, or tofu, and low-fat dairy. Your health care provider will help you determine the amount of weight gain that is right for you.  Avoid raw meat and uncooked cheese. These carry germs that can cause birth defects in the baby.  If you have low calcium intake from food, talk to your health care provider about whether you should take a daily calcium supplement.  Eat four or five small meals rather than three large meals a day.  Limit foods that are high in fat and processed sugars, such as fried and sweet foods.  To prevent constipation: ? Drink enough fluid to keep your urine clear or pale yellow. ? Eat foods that are high in fiber, such as fresh fruits and vegetables, whole grains, and beans. Activity  Exercise only as directed by your health care provider. Most women can  continue their usual exercise routine during pregnancy. Try to exercise for 30 minutes at least 5 days a week. Stop exercising if you experience uterine contractions.  Avoid heavy lifting.  Do not exercise in extreme heat or humidity, or at high altitudes.  Wear low-heel, comfortable shoes.  Practice good posture.  You may continue to have sex unless your health care provider tells you otherwise. Relieving pain and discomfort  Take frequent breaks and rest with your legs elevated if you have leg cramps or low back pain.  Take warm sitz baths to soothe any pain or discomfort caused by hemorrhoids. Use hemorrhoid cream if your health care provider approves.  Wear a good support bra to prevent discomfort from breast tenderness.  If you develop varicose veins: ? Wear support pantyhose or compression stockings as told by your healthcare provider. ? Elevate your feet for 15 minutes, 3-4 times a day. Prenatal care  Write down your questions. Take them to your prenatal visits.  Keep all your prenatal visits as told by your health care provider. This is important. Safety  Wear your seat belt at all times when driving.  Make a list of emergency phone numbers, including numbers for family, friends, the hospital, and police and fire departments. General instructions  Avoid cat litter boxes and soil used by cats. These carry germs that can cause birth defects in the baby. If you have a cat, ask someone to clean the litter box for you.  Do not travel far distances unless it is absolutely necessary and only with the approval of your health care provider.  Do not use hot tubs, steam rooms, or saunas.  Do not drink alcohol.  Do not use any products that contain nicotine or tobacco, such as cigarettes and e-cigarettes. If you need help quitting, ask your health care provider.  Do not use any medicinal herbs or unprescribed drugs. These chemicals affect the formation and growth of the  baby.  Do not douche or use tampons or scented sanitary pads.  Do not cross your legs for long periods of time.  To prepare for the arrival of your baby: ? Take prenatal classes to understand, practice, and ask questions about labor and delivery. ? Make a trial run to the hospital. ? Visit the hospital and tour the maternity area. ? Arrange for maternity or paternity leave through employers. ? Arrange for family and friends to take care of pets while you are in the hospital. ? Purchase a rear-facing car seat and make sure you know how to install it in your car. ? Pack your hospital bag. ? Prepare the baby's nursery. Make sure to remove all pillows and stuffed animals from the baby's crib to prevent suffocation.  Visit your dentist if you have not gone during your pregnancy.  Use a soft toothbrush to brush your teeth and be gentle when you floss. Contact a health care provider if:  You are unsure if you are in labor or if your water has broken.  You become dizzy.  You have mild pelvic cramps, pelvic pressure, or nagging pain in your abdominal area.  You have lower back pain.  You have persistent nausea, vomiting, or diarrhea.  You have an unusual or bad smelling vaginal discharge.  You have pain when you urinate. Get help right away if:  Your water breaks before 37 weeks.  You have regular contractions less than 5 minutes apart before 37 weeks.  You have a fever.  You are leaking fluid from your vagina.  You have spotting or bleeding from your vagina.  You have severe abdominal pain or cramping.  You have rapid weight loss or weight gain.  You have shortness of breath with chest pain.  You notice sudden or extreme swelling of your face, hands, ankles, feet, or legs.  Your baby makes fewer than 10 movements in 2 hours.  You have severe headaches that do not go away when you take medicine.  You have vision changes. Summary  The third trimester is from week 28  through week 40, months 7 through 9. The third trimester is a time when the unborn baby (fetus) is growing rapidly.  During the third trimester, your discomfort may increase as you and your baby continue to gain weight. You may have abdominal, leg, and back pain, sleeping problems, and an increased need to urinate.  During the third trimester your breasts will keep growing and they will continue to become tender. A yellow fluid (colostrum) may leak from your breasts. This is the first milk you are producing for your baby.  False labor is a condition in which you feel small, irregular tightenings of the muscles in the womb (contractions) that eventually go away. These are called Braxton Hicks contractions. Contractions may last for hours, days, or even weeks before true labor sets in.  Signs of labor can include: abdominal cramps; regular contractions that start at 10 minutes apart and become stronger and more frequent with time; watery or bloody mucus discharge that comes from the vagina; increased pelvic pressure and dull back pain; and leaking of amniotic fluid. This information is not intended to replace advice given to you by your health care provider. Make sure you discuss any questions you have with your health care provider. Document Released: 09/01/2001 Document Revised: 02/13/2016 Document Reviewed: 11/08/2012 Elsevier Interactive Patient Education  2017 [employer]

## 2017-09-30 NOTE — Progress Notes (Signed)
Pt denies concerns at this time. 

## 2017-10-01 ENCOUNTER — Encounter (HOSPITAL_COMMUNITY): Payer: Self-pay

## 2017-10-01 ENCOUNTER — Other Ambulatory Visit (HOSPITAL_COMMUNITY): Payer: Self-pay | Admitting: Maternal and Fetal Medicine

## 2017-10-01 ENCOUNTER — Ambulatory Visit (HOSPITAL_COMMUNITY)
Admission: RE | Admit: 2017-10-01 | Discharge: 2017-10-01 | Disposition: A | Discharge status: Home / Self Care | Payer: Managed Care, Other (non HMO) | Source: Ambulatory Visit | Attending: Certified Nurse Midwife | Admitting: Certified Nurse Midwife

## 2017-10-01 DIAGNOSIS — Z362 Encounter for other antenatal screening follow-up: Secondary | ICD-10-CM

## 2017-10-01 DIAGNOSIS — O0990 Supervision of high risk pregnancy, unspecified, unspecified trimester: Secondary | ICD-10-CM

## 2017-10-01 DIAGNOSIS — O10919 Unspecified pre-existing hypertension complicating pregnancy, unspecified trimester: Secondary | ICD-10-CM

## 2017-10-01 DIAGNOSIS — O10013 Pre-existing essential hypertension complicating pregnancy, third trimester: Secondary | ICD-10-CM | POA: Diagnosis not present

## 2017-10-01 DIAGNOSIS — Z3A32 32 weeks gestation of pregnancy: Secondary | ICD-10-CM | POA: Diagnosis not present

## 2017-10-01 DIAGNOSIS — O99213 Obesity complicating pregnancy, third trimester: Secondary | ICD-10-CM | POA: Diagnosis not present

## 2017-10-01 DIAGNOSIS — O365930 Maternal care for other known or suspected poor fetal growth, third trimester, not applicable or unspecified: Secondary | ICD-10-CM | POA: Insufficient documentation

## 2017-10-01 NOTE — Procedures (Signed)
Christine Mills Mar 06, 1996 2832w0d  Fetus A Non-Stress Test Interpretation for 10/01/17  Indication: Unsatisfactory BPP  Fetal Heart Rate A Mode: External Baseline Rate (A): 140 bpm Variability: Moderate Accelerations: 15 x 15 Decelerations: None Multiple birth?: No  Uterine Activity Mode: Toco Contraction Frequency (min): none noted  Interpretation (Fetal Testing) Nonstress Test Interpretation: Reactive Comments: FHR tracing rev'd by Dr. Sherrie Georgeecker

## 2017-10-04 NOTE — Progress Notes (Addendum)
   PRENATAL VISIT NOTE  Subjective:  Christine Mills is a 22 y.o. G2P0010 at 44w4dbeing seen today for ongoing prenatal care.  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  Patient reports no complaints.  Contractions: Not present. Vag. Bleeding: None.  Movement: Present. Denies leaking of fluid.   The following portions of the patient's history were reviewed and updated as appropriate: allergies, current medications, past family history, past medical history, past social history, past surgical history and problem list. Problem list updated.  Objective:   Vitals:   09/30/17 0827  BP: (!) 147/75  Pulse: (!) 123  Weight: 257 lb 9.6 oz (116.8 kg)    Fetal Status:     Movement: Present     General:  Alert, oriented and cooperative. Patient is in no acute distress.  Skin: Skin is warm and dry. No rash noted.   Cardiovascular: Normal heart rate noted  Respiratory: Normal respiratory effort, no problems with respiration noted  Abdomen: Soft, gravid, appropriate for gestational age.  Pain/Pressure: Absent     Pelvic: Cervical exam deferred        Extremities: Normal range of motion.  Edema: None  Mental Status:  Normal mood and affect. Normal behavior. Normal judgment and thought content.    NST: + accels, no decels, moderate variability, Cat. 1 tracing. No contractions on toco.   Assessment and Plan:  Pregnancy: G2P0010 at 39w4d1. Supervision of high risk pregnancy, antepartum     Doing well.  Reactive NST for CHTN.   2. Rubella non-immune status, antepartum    MMR postpartum  3. Low vitamin D level     Taking weekly vitamin D  4. Chronic hypertension during pregnancy,  antepartum      Stable on Labetalol.   Preterm labor symptoms and general obstetric precautions including but not limited to vaginal bleeding, contractions, leaking of fluid and fetal movement were reviewed in detail with the patient. Please refer to After Visit Summary for other counseling recommendations.  Return in about 1 week (around 10/07/2017) for Bi-weekly Antenatal testing: NST's , HOB.   RaMorene CrockerCNM

## 2017-10-06 ENCOUNTER — Ambulatory Visit (INDEPENDENT_AMBULATORY_CARE_PROVIDER_SITE_OTHER): Payer: Managed Care, Other (non HMO) | Admitting: Obstetrics and Gynecology

## 2017-10-06 ENCOUNTER — Encounter: Payer: Self-pay | Admitting: Obstetrics and Gynecology

## 2017-10-06 VITALS — BP 123/74 | HR 101 | Wt 262.1 lb

## 2017-10-06 DIAGNOSIS — O10913 Unspecified pre-existing hypertension complicating pregnancy, third trimester: Secondary | ICD-10-CM

## 2017-10-06 DIAGNOSIS — Z2839 Other underimmunization status: Secondary | ICD-10-CM

## 2017-10-06 DIAGNOSIS — O0990 Supervision of high risk pregnancy, unspecified, unspecified trimester: Secondary | ICD-10-CM

## 2017-10-06 DIAGNOSIS — Z283 Underimmunization status: Secondary | ICD-10-CM

## 2017-10-06 DIAGNOSIS — O0993 Supervision of high risk pregnancy, unspecified, third trimester: Secondary | ICD-10-CM

## 2017-10-06 DIAGNOSIS — O10919 Unspecified pre-existing hypertension complicating pregnancy, unspecified trimester: Secondary | ICD-10-CM

## 2017-10-06 DIAGNOSIS — O9989 Other specified diseases and conditions complicating pregnancy, childbirth and the puerperium: Secondary | ICD-10-CM

## 2017-10-06 NOTE — Progress Notes (Signed)
Patient reports good fetal movement, denies pain. 

## 2017-10-06 NOTE — Progress Notes (Signed)
   PRENATAL VISIT NOTE  Subjective:  Christine Mills is a 22 y.o. G2P0010 at 5116w5d being seen today for ongoing prenatal care.  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  Patient reports no complaints.  Contractions: Not present. Vag. Bleeding: None.  Movement: Present. Denies leaking of fluid.   The following portions of the patient's history were reviewed and updated as appropriate: allergies, current medications, past family history, past medical history, past social history, past surgical history and problem list. Problem list updated.  Objective:   Vitals:   10/06/17 0918  BP: 123/74  Pulse: (!) 101  Weight: 262 lb 1.6 oz (118.9 kg)    Fetal Status: Fetal Heart Rate (bpm): NST   Movement: Present     General:  Alert, oriented and cooperative. Patient is in no acute distress.  Skin: Skin is warm and dry. No rash noted.   Cardiovascular: Normal heart rate noted  Respiratory: Normal respiratory effort, no problems with respiration noted  Abdomen: Soft, gravid, appropriate for gestational age.  Pain/Pressure: Absent     Pelvic: Cervical exam deferred        Extremities: Normal range of motion.  Edema: None  Mental Status:  Normal mood and affect. Normal behavior. Normal judgment and thought content.   Assessment and Plan:  Pregnancy: G2P0010 at 2616w5d  1. Supervision of high risk pregnancy, antepartum Patient is doing well without complaints  2. Chronic hypertension during pregnancy, antepartum BP normal on labetalol Continue ASA Follow up BPP and dopplers on 1/18 - Fetal nonstress test; Future- reviewed and reactive with baseline 130, mod variability, + accels, no  decels  3. Rubella non-immune status, antepartum Will offer pp  Preterm labor symptoms and general obstetric precautions including but not limited to vaginal bleeding, contractions, leaking of fluid and fetal movement were reviewed in detail with the patient. Please refer to After Visit Summary for other counseling recommendations.  Return in about 1 week (around 10/13/2017) for ROB, NST.   Catalina AntiguaPeggy Shenita Trego, MD

## 2017-10-08 ENCOUNTER — Ambulatory Visit (HOSPITAL_COMMUNITY)
Admission: RE | Admit: 2017-10-08 | Discharge: 2017-10-08 | Disposition: A | Discharge status: Home / Self Care | Payer: Managed Care, Other (non HMO) | Source: Ambulatory Visit | Attending: Certified Nurse Midwife | Admitting: Certified Nurse Midwife

## 2017-10-08 ENCOUNTER — Encounter (HOSPITAL_COMMUNITY): Payer: Self-pay

## 2017-10-08 DIAGNOSIS — Z7982 Long term (current) use of aspirin: Secondary | ICD-10-CM | POA: Insufficient documentation

## 2017-10-08 DIAGNOSIS — O99213 Obesity complicating pregnancy, third trimester: Secondary | ICD-10-CM | POA: Insufficient documentation

## 2017-10-08 DIAGNOSIS — Z79899 Other long term (current) drug therapy: Secondary | ICD-10-CM | POA: Diagnosis not present

## 2017-10-08 DIAGNOSIS — O365930 Maternal care for other known or suspected poor fetal growth, third trimester, not applicable or unspecified: Secondary | ICD-10-CM | POA: Insufficient documentation

## 2017-10-08 DIAGNOSIS — O10013 Pre-existing essential hypertension complicating pregnancy, third trimester: Secondary | ICD-10-CM | POA: Diagnosis not present

## 2017-10-08 DIAGNOSIS — Z3A33 33 weeks gestation of pregnancy: Secondary | ICD-10-CM | POA: Diagnosis not present

## 2017-10-08 DIAGNOSIS — E669 Obesity, unspecified: Secondary | ICD-10-CM | POA: Insufficient documentation

## 2017-10-12 ENCOUNTER — Ambulatory Visit (INDEPENDENT_AMBULATORY_CARE_PROVIDER_SITE_OTHER): Payer: Managed Care, Other (non HMO) | Admitting: Obstetrics and Gynecology

## 2017-10-12 VITALS — BP 135/74 | HR 112 | Wt 258.7 lb

## 2017-10-12 DIAGNOSIS — O9989 Other specified diseases and conditions complicating pregnancy, childbirth and the puerperium: Secondary | ICD-10-CM

## 2017-10-12 DIAGNOSIS — Z283 Underimmunization status: Secondary | ICD-10-CM

## 2017-10-12 DIAGNOSIS — O10919 Unspecified pre-existing hypertension complicating pregnancy, unspecified trimester: Secondary | ICD-10-CM

## 2017-10-12 DIAGNOSIS — O09899 Supervision of other high risk pregnancies, unspecified trimester: Secondary | ICD-10-CM

## 2017-10-12 DIAGNOSIS — R85612 Low grade squamous intraepithelial lesion on cytologic smear of anus (LGSIL): Secondary | ICD-10-CM

## 2017-10-12 DIAGNOSIS — O10913 Unspecified pre-existing hypertension complicating pregnancy, third trimester: Secondary | ICD-10-CM

## 2017-10-12 DIAGNOSIS — O0990 Supervision of high risk pregnancy, unspecified, unspecified trimester: Secondary | ICD-10-CM

## 2017-10-12 DIAGNOSIS — O0993 Supervision of high risk pregnancy, unspecified, third trimester: Secondary | ICD-10-CM

## 2017-10-12 NOTE — Patient Instructions (Signed)
eContraception Choices Contraception, also called birth control, refers to methods or devices that prevent pregnancy. Hormonal methods Contraceptive implant A contraceptive implant is a thin, plastic tube that contains a hormone. It is inserted into the upper part of the arm. It can remain in place for up to 3 years. Progestin-only injections Progestin-only injections are injections of progestin, a synthetic form of the hormone progesterone. They are given every 3 months by a health care provider. Birth control pills Birth control pills are pills that contain hormones that prevent pregnancy. They must be taken once a day, preferably at the same time each day. Birth control patch The birth control patch contains hormones that prevent pregnancy. It is placed on the skin and must be changed once a week for three weeks and removed on the fourth week. A prescription is needed to use this method of contraception. Vaginal ring A vaginal ring contains hormones that prevent pregnancy. It is placed in the vagina for three weeks and removed on the fourth week. After that, the process is repeated with a new ring. A prescription is needed to use this method of contraception. Emergency contraceptive Emergency contraceptives prevent pregnancy after unprotected sex. They come in pill form and can be taken up to 5 days after sex. They work best the sooner they are taken after having sex. Most emergency contraceptives are available without a prescription. This method should not be used as your only form of birth control. Barrier methods Female condom A female condom is a thin sheath that is worn over the penis during sex. Condoms keep sperm from going inside a woman's body. They can be used with a spermicide to increase their effectiveness. They should be disposed after a single use. Female condom A female condom is a soft, loose-fitting sheath that is put into the vagina before sex. The condom keeps sperm from going  inside a woman's body. They should be disposed after a single use. Diaphragm A diaphragm is a soft, dome-shaped barrier. It is inserted into the vagina before sex, along with a spermicide. The diaphragm blocks sperm from entering the uterus, and the spermicide kills sperm. A diaphragm should be left in the vagina for 6-8 hours after sex and removed within 24 hours. A diaphragm is prescribed and fitted by a health care provider. A diaphragm should be replaced every 1-2 years, after giving birth, after gaining more than 15 lb (6.8 kg), and after pelvic surgery. Cervical cap A cervical cap is a round, soft latex or plastic cup that fits over the cervix. It is inserted into the vagina before sex, along with spermicide. It blocks sperm from entering the uterus. The cap should be left in place for 6-8 hours after sex and removed within 48 hours. A cervical cap must be prescribed and fitted by a health care provider. It should be replaced every 2 years. Sponge A sponge is a soft, circular piece of polyurethane foam with spermicide on it. The sponge helps block sperm from entering the uterus, and the spermicide kills sperm. To use it, you make it wet and then insert it into the vagina. It should be inserted before sex, left in for at least 6 hours after sex, and removed and thrown away within 30 hours. Spermicides Spermicides are chemicals that kill or block sperm from entering the cervix and uterus. They can come as a cream, jelly, suppository, foam, or tablet. A spermicide should be inserted into the vagina with an applicator at least 10-15 minutes before  sex to allow time for it to work. The process must be repeated every time you have sex. Spermicides do not require a prescription. Intrauterine contraception Intrauterine device (IUD) An IUD is a T-shaped device that is put in a woman's uterus. There are two types:  Hormone IUD.This type contains progestin, a synthetic form of the hormone progesterone. This  type can stay in place for 3-5 years.  Copper IUD.This type is wrapped in copper wire. It can stay in place for 10 years.  Permanent methods of contraception Female tubal ligation In this method, a woman's fallopian tubes are sealed, tied, or blocked during surgery to prevent eggs from traveling to the uterus. Hysteroscopic sterilization In this method, a small, flexible insert is placed into each fallopian tube. The inserts cause scar tissue to form in the fallopian tubes and block them, so sperm cannot reach an egg. The procedure takes about 3 months to be effective. Another form of birth control must be used during those 3 months. Female sterilization This is a procedure to tie off the tubes that carry sperm (vasectomy). After the procedure, the man can still ejaculate fluid (semen). Natural planning methods Natural family planning In this method, a couple does not have sex on days when the woman could become pregnant. Calendar method This means keeping track of the length of each menstrual cycle, identifying the days when pregnancy can happen, and not having sex on those days. Ovulation method In this method, a couple avoids sex during ovulation. Symptothermal method This method involves not having sex during ovulation. The woman typically checks for ovulation by watching changes in her temperature and in the consistency of cervical mucus. Post-ovulation method In this method, a couple waits to have sex until after ovulation. Summary  Contraception, also called birth control, means methods or devices that prevent pregnancy.  Hormonal methods of contraception include implants, injections, pills, patches, vaginal rings, and emergency contraceptives.  Barrier methods of contraception can include female condoms, female condoms, diaphragms, cervical caps, sponges, and spermicides.  There are two types of IUDs (intrauterine devices). An IUD can be put in a woman's uterus to prevent pregnancy  for 3-5 years.  Permanent sterilization can be done through a procedure for males, females, or both.  Natural family planning methods involve not having sex on days when the woman could become pregnant. This information is not intended to replace advice given to you by your health care provider. Make sure you discuss any questions you have with your health care provider. Document Released: 09/07/2005 Document Revised: 10/10/2016 Document Reviewed: 10/10/2016 Elsevier Interactive Patient Education  2018 [employer] Inc[employer]  

## 2017-10-12 NOTE — Progress Notes (Signed)
   PRENATAL VISIT NOTE  Subjective:  Christine Mills is a 22 y.o. G2P0010 at 86w4dbeing seen today for ongoing prenatal care.  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  Patient reports no complaints.  Contractions: Not present. Vag. Bleeding: None.  Movement: Present. Denies leaking of fluid.   The following portions of the patient's history were reviewed and updated as appropriate: allergies, current medications, past family history, past medical history, past social history, past surgical history and problem list. Problem list updated.  Objective:   Vitals:   10/12/17 0900  BP: 135/74  Pulse: (!) 112  Weight: 258 lb 11.2 oz (117.3 kg)    Fetal Status: Fetal Heart Rate (bpm): NST   Movement: Present     General:  Alert, oriented and cooperative. Patient is in no acute distress.  Skin: Skin is warm and dry. No rash noted.   Cardiovascular: Normal heart rate noted  Respiratory: Normal respiratory effort, no problems with respiration noted  Abdomen: Soft, gravid, appropriate for gestational age.  Pain/Pressure: Absent     Pelvic: Cervical exam deferred        Extremities: Normal range of motion.  Edema: None  Mental Status:  Normal mood and affect. Normal behavior. Normal judgment and thought content.   Assessment and Plan:  Pregnancy: G2P0010 at 357w4d1. Supervision of high risk pregnancy, antepartum counseled regarding contraception, undecided  2. Chronic hypertension during pregnancy, antepartum BP stable On labetalol 100 mg BID Cont baby ASA BPP/growth/cord dopplers with MFM on 1/25 NST today reactive Last growth 29th%tile with AC 7th%tile  3. Rubella  non-immune status, antepartum MMR pp  4. 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 Please refer to After Visit Summary for other counseling recommendations.  Return in about 2 weeks (around 10/26/2017) for OB visit (MD), 1 week for NST/BPP (BPP scheduled with MFM already).   KeSloan LeiterMD

## 2017-10-12 NOTE — Progress Notes (Signed)
Patient reports good fetal movement, denies pain. 

## 2017-10-15 ENCOUNTER — Ambulatory Visit (HOSPITAL_COMMUNITY)
Admission: RE | Admit: 2017-10-15 | Discharge: 2017-10-15 | Disposition: A | Discharge status: Home / Self Care | Payer: Managed Care, Other (non HMO) | Source: Ambulatory Visit | Attending: Certified Nurse Midwife | Admitting: Certified Nurse Midwife

## 2017-10-15 ENCOUNTER — Encounter (HOSPITAL_COMMUNITY): Payer: Self-pay

## 2017-10-15 DIAGNOSIS — O365930 Maternal care for other known or suspected poor fetal growth, third trimester, not applicable or unspecified: Secondary | ICD-10-CM

## 2017-10-15 DIAGNOSIS — Z3A34 34 weeks gestation of pregnancy: Secondary | ICD-10-CM | POA: Diagnosis not present

## 2017-10-15 DIAGNOSIS — O0990 Supervision of high risk pregnancy, unspecified, unspecified trimester: Secondary | ICD-10-CM

## 2017-10-15 DIAGNOSIS — O10013 Pre-existing essential hypertension complicating pregnancy, third trimester: Secondary | ICD-10-CM | POA: Diagnosis not present

## 2017-10-15 DIAGNOSIS — Z362 Encounter for other antenatal screening follow-up: Secondary | ICD-10-CM | POA: Diagnosis not present

## 2017-10-15 DIAGNOSIS — O99213 Obesity complicating pregnancy, third trimester: Secondary | ICD-10-CM | POA: Insufficient documentation

## 2017-10-18 ENCOUNTER — Other Ambulatory Visit (HOSPITAL_COMMUNITY): Payer: Self-pay | Admitting: *Deleted

## 2017-10-18 DIAGNOSIS — O10913 Unspecified pre-existing hypertension complicating pregnancy, third trimester: Secondary | ICD-10-CM

## 2017-10-19 ENCOUNTER — Ambulatory Visit (INDEPENDENT_AMBULATORY_CARE_PROVIDER_SITE_OTHER): Payer: Managed Care, Other (non HMO) | Admitting: Obstetrics and Gynecology

## 2017-10-19 ENCOUNTER — Encounter: Payer: Self-pay | Admitting: Obstetrics and Gynecology

## 2017-10-19 VITALS — BP 119/74 | HR 118 | Wt 261.8 lb

## 2017-10-19 DIAGNOSIS — O0990 Supervision of high risk pregnancy, unspecified, unspecified trimester: Secondary | ICD-10-CM

## 2017-10-19 DIAGNOSIS — O10913 Unspecified pre-existing hypertension complicating pregnancy, third trimester: Secondary | ICD-10-CM | POA: Diagnosis not present

## 2017-10-19 DIAGNOSIS — O10919 Unspecified pre-existing hypertension complicating pregnancy, unspecified trimester: Secondary | ICD-10-CM

## 2017-10-19 DIAGNOSIS — O09899 Supervision of other high risk pregnancies, unspecified trimester: Secondary | ICD-10-CM

## 2017-10-19 DIAGNOSIS — Z283 Underimmunization status: Secondary | ICD-10-CM

## 2017-10-19 DIAGNOSIS — O9989 Other specified diseases and conditions complicating pregnancy, childbirth and the puerperium: Secondary | ICD-10-CM

## 2017-10-19 NOTE — Progress Notes (Signed)
Subjective:  Christine Mills is a 22 y.o. G2P0010 at 1655w4d being seen today for ongoing prenatal care.  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  Patient reports no complaints.  Contractions: Not present. Vag. Bleeding: None.  Movement: Present. Denies leaking of fluid.   The following portions of the patient's history were reviewed and updated as appropriate: allergies, current medications, past family history, past medical history, past social history, past surgical history and problem list. Problem list updated.  Objective:   Vitals:   10/19/17 0857  BP: 119/74  Pulse: (!) 118  Weight: 261 lb 12.8 oz (118.8 kg)    Fetal Status: Fetal Heart Rate (bpm): NST   Movement: Present     General:  Alert, oriented and cooperative. Patient is in no acute distress.  Skin: Skin is warm and dry. No rash noted.   Cardiovascular: Normal heart rate noted  Respiratory: Normal respiratory effort, no problems with respiration noted  Abdomen: Soft, gravid, appropriate for gestational age. Pain/Pressure: Absent     Pelvic:  Cervical exam deferred        Extremities: Normal range of motion.  Edema: None  Mental Status: Normal mood and affect. Normal behavior. Normal judgment and thought content.   Urinalysis:      Assessment and Plan:  Pregnancy: G2P0010 at 4055w4d  1. Supervision of high risk pregnancy, antepartum Stable  2. Rubella non-immune status, antepartum Vaccine postpartum  3. Chronic hypertension during pregnancy, antepartum RNST today BPP this Friday BP stable, continue with labetalol and BASA Continue with antenatal testing Growth scan 1/25 appropriate interval growth  Preterm labor  symptoms and general obstetric precautions including but not limited to vaginal bleeding, contractions, leaking of fluid and fetal movement were reviewed in detail with the patient. Please refer to After Visit Summary for other counseling recommendations.  Return in about 1 week (around 10/26/2017) for OB visit.   Hermina StaggersErvin, Karema Tocci L, MD

## 2017-10-19 NOTE — Patient Instructions (Signed)
Third Trimester of Pregnancy The third trimester is from week 28 through week 40 (months 7 through 9). The third trimester is a time when the unborn baby (fetus) is growing rapidly. At the end of the ninth month, the fetus is about 20 inches in length and weighs 6-10 pounds. Body changes during your third trimester Your body will continue to go through many changes during pregnancy. The changes vary from woman to woman. During the third trimester:  Your weight will continue to increase. You can expect to gain 25-35 pounds (11-16 kg) by the end of the pregnancy.  You may begin to get stretch marks on your hips, abdomen, and breasts.  You may urinate more often because the fetus is moving lower into your pelvis and pressing on your bladder.  You may develop or continue to have heartburn. This is caused by increased hormones that slow down muscles in the digestive tract.  You may develop or continue to have constipation because increased hormones slow digestion and cause the muscles that push waste through your intestines to relax.  You may develop hemorrhoids. These are swollen veins (varicose veins) in the rectum that can itch or be painful.  You may develop swollen, bulging veins (varicose veins) in your legs.  You may have increased body aches in the pelvis, back, or thighs. This is due to weight gain and increased hormones that are relaxing your joints.  You may have changes in your hair. These can include thickening of your hair, rapid growth, and changes in texture. Some women also have hair loss during or after pregnancy, or hair that feels dry or thin. Your hair will most likely return to normal after your baby is born.  Your breasts will continue to grow and they will continue to become tender. A yellow fluid (colostrum) may leak from your breasts. This is the first milk you are producing for your baby.  Your belly button may stick out.  You may notice more swelling in your hands,  face, or ankles.  You may have increased tingling or numbness in your hands, arms, and legs. The skin on your belly may also feel numb.  You may feel short of breath because of your expanding uterus.  You may have more problems sleeping. This can be caused by the size of your belly, increased need to urinate, and an increase in your body's metabolism.  You may notice the fetus "dropping," or moving lower in your abdomen (lightening).  You may have increased vaginal discharge.  You may notice your joints feel loose and you may have pain around your pelvic bone.  What to expect at prenatal visits You will have prenatal exams every 2 weeks until week 36. Then you will have weekly prenatal exams. During a routine prenatal visit:  You will be weighed to make sure you and the baby are growing normally.  Your blood pressure will be taken.  Your abdomen will be measured to track your baby's growth.  The fetal heartbeat will be listened to.  Any test results from the previous visit will be discussed.  You may have a cervical check near your due date to see if your cervix has softened or thinned (effaced).  You will be tested for Group B streptococcus. This happens between 35 and 37 weeks.  Your health care provider may ask you:  What your birth plan is.  How you are feeling.  If you are feeling the baby move.  If you have had   any abnormal symptoms, such as leaking fluid, bleeding, severe headaches, or abdominal cramping.  If you are using any tobacco products, including cigarettes, chewing tobacco, and electronic cigarettes.  If you have any questions.  Other tests or screenings that may be performed during your third trimester include:  Blood tests that check for low iron levels (anemia).  Fetal testing to check the health, activity level, and growth of the fetus. Testing is done if you have certain medical conditions or if there are problems during the  pregnancy.  Nonstress test (NST). This test checks the health of your baby to make sure there are no signs of problems, such as the baby not getting enough oxygen. During this test, a belt is placed around your belly. The baby is made to move, and its heart rate is monitored during movement.  What is false labor? False labor is a condition in which you feel small, irregular tightenings of the muscles in the womb (contractions) that usually go away with rest, changing position, or drinking water. These are called Braxton Hicks contractions. Contractions may last for hours, days, or even weeks before true labor sets in. If contractions come at regular intervals, become more frequent, increase in intensity, or become painful, you should see your health care provider. What are the signs of labor?  Abdominal cramps.  Regular contractions that start at 10 minutes apart and become stronger and more frequent with time.  Contractions that start on the top of the uterus and spread down to the lower abdomen and back.  Increased pelvic pressure and dull back pain.  A watery or bloody mucus discharge that comes from the vagina.  Leaking of amniotic fluid. This is also known as your "water breaking." It could be a slow trickle or a gush. Let your health care provider know if it has a color or strange odor. If you have any of these signs, call your health care provider right away, even if it is before your due date. Follow these instructions at home: Medicines  Follow your health care provider's instructions regarding medicine use. Specific medicines may be either safe or unsafe to take during pregnancy.  Take a prenatal vitamin that contains at least 600 micrograms (mcg) of folic acid.  If you develop constipation, try taking a stool softener if your health care provider approves. Eating and drinking  Eat a balanced diet that includes fresh fruits and vegetables, whole grains, good sources of protein  such as meat, eggs, or tofu, and low-fat dairy. Your health care provider will help you determine the amount of weight gain that is right for you.  Avoid raw meat and uncooked cheese. These carry germs that can cause birth defects in the baby.  If you have low calcium intake from food, talk to your health care provider about whether you should take a daily calcium supplement.  Eat four or five small meals rather than three large meals a day.  Limit foods that are high in fat and processed sugars, such as fried and sweet foods.  To prevent constipation: ? Drink enough fluid to keep your urine clear or pale yellow. ? Eat foods that are high in fiber, such as fresh fruits and vegetables, whole grains, and beans. Activity  Exercise only as directed by your health care provider. Most women can continue their usual exercise routine during pregnancy. Try to exercise for 30 minutes at least 5 days a week. Stop exercising if you experience uterine contractions.  Avoid heavy   lifting.  Do not exercise in extreme heat or humidity, or at high altitudes.  Wear low-heel, comfortable shoes.  Practice good posture.  You may continue to have sex unless your health care provider tells you otherwise. Relieving pain and discomfort  Take frequent breaks and rest with your legs elevated if you have leg cramps or low back pain.  Take warm sitz baths to soothe any pain or discomfort caused by hemorrhoids. Use hemorrhoid cream if your health care provider approves.  Wear a good support bra to prevent discomfort from breast tenderness.  If you develop varicose veins: ? Wear support pantyhose or compression stockings as told by your healthcare provider. ? Elevate your feet for 15 minutes, 3-4 times a day. Prenatal care  Write down your questions. Take them to your prenatal visits.  Keep all your prenatal visits as told by your health care provider. This is important. Safety  Wear your seat belt at  all times when driving.  Make a list of emergency phone numbers, including numbers for family, friends, the hospital, and police and fire departments. General instructions  Avoid cat litter boxes and soil used by cats. These carry germs that can cause birth defects in the baby. If you have a cat, ask someone to clean the litter box for you.  Do not travel far distances unless it is absolutely necessary and only with the approval of your health care provider.  Do not use hot tubs, steam rooms, or saunas.  Do not drink alcohol.  Do not use any products that contain nicotine or tobacco, such as cigarettes and e-cigarettes. If you need help quitting, ask your health care provider.  Do not use any medicinal herbs or unprescribed drugs. These chemicals affect the formation and growth of the baby.  Do not douche or use tampons or scented sanitary pads.  Do not cross your legs for long periods of time.  To prepare for the arrival of your baby: ? Take prenatal classes to understand, practice, and ask questions about labor and delivery. ? Make a trial run to the hospital. ? Visit the hospital and tour the maternity area. ? Arrange for maternity or paternity leave through employers. ? Arrange for family and friends to take care of pets while you are in the hospital. ? Purchase a rear-facing car seat and make sure you know how to install it in your car. ? Pack your hospital bag. ? Prepare the baby's nursery. Make sure to remove all pillows and stuffed animals from the baby's crib to prevent suffocation.  Visit your dentist if you have not gone during your pregnancy. Use a soft toothbrush to brush your teeth and be gentle when you floss. Contact a health care provider if:  You are unsure if you are in labor or if your water has broken.  You become dizzy.  You have mild pelvic cramps, pelvic pressure, or nagging pain in your abdominal area.  You have lower back pain.  You have persistent  nausea, vomiting, or diarrhea.  You have an unusual or bad smelling vaginal discharge.  You have pain when you urinate. Get help right away if:  Your water breaks before 37 weeks.  You have regular contractions less than 5 minutes apart before 37 weeks.  You have a fever.  You are leaking fluid from your vagina.  You have spotting or bleeding from your vagina.  You have severe abdominal pain or cramping.  You have rapid weight loss or weight gain.    You have shortness of breath with chest pain.  You notice sudden or extreme swelling of your face, hands, ankles, feet, or legs.  Your baby makes fewer than 10 movements in 2 hours.  You have severe headaches that do not go away when you take medicine.  You have vision changes. Summary  The third trimester is from week 28 through week 40, months 7 through 9. The third trimester is a time when the unborn baby (fetus) is growing rapidly.  During the third trimester, your discomfort may increase as you and your baby continue to gain weight. You may have abdominal, leg, and back pain, sleeping problems, and an increased need to urinate.  During the third trimester your breasts will keep growing and they will continue to become tender. A yellow fluid (colostrum) may leak from your breasts. This is the first milk you are producing for your baby.  False labor is a condition in which you feel small, irregular tightenings of the muscles in the womb (contractions) that eventually go away. These are called Braxton Hicks contractions. Contractions may last for hours, days, or even weeks before true labor sets in.  Signs of labor can include: abdominal cramps; regular contractions that start at 10 minutes apart and become stronger and more frequent with time; watery or bloody mucus discharge that comes from the vagina; increased pelvic pressure and dull back pain; and leaking of amniotic fluid. This information is not intended to replace advice  given to you by your health care provider. Make sure you discuss any questions you have with your health care provider. Document Released: 09/01/2001 Document Revised: 02/13/2016 Document Reviewed: 11/08/2012 Elsevier Interactive Patient Education  2017 [employer]  

## 2017-10-19 NOTE — Addendum Note (Signed)
Addended by: Hamilton CapriBURCH, Syreeta Figler J on: 10/19/2017 10:22 AM   Modules accepted: Orders

## 2017-10-21 ENCOUNTER — Encounter: Payer: Self-pay | Admitting: Obstetrics and Gynecology

## 2017-10-22 ENCOUNTER — Encounter (HOSPITAL_COMMUNITY): Payer: Self-pay

## 2017-10-22 ENCOUNTER — Other Ambulatory Visit (HOSPITAL_COMMUNITY): Payer: Self-pay | Admitting: Obstetrics and Gynecology

## 2017-10-22 ENCOUNTER — Ambulatory Visit (HOSPITAL_COMMUNITY)
Admission: RE | Admit: 2017-10-22 | Discharge: 2017-10-22 | Disposition: A | Discharge status: Home / Self Care | Payer: Managed Care, Other (non HMO) | Source: Ambulatory Visit | Attending: Certified Nurse Midwife | Admitting: Certified Nurse Midwife

## 2017-10-22 DIAGNOSIS — Z3A35 35 weeks gestation of pregnancy: Secondary | ICD-10-CM | POA: Diagnosis not present

## 2017-10-22 DIAGNOSIS — O163 Unspecified maternal hypertension, third trimester: Secondary | ICD-10-CM

## 2017-10-22 DIAGNOSIS — O365930 Maternal care for other known or suspected poor fetal growth, third trimester, not applicable or unspecified: Secondary | ICD-10-CM

## 2017-10-22 DIAGNOSIS — O99213 Obesity complicating pregnancy, third trimester: Secondary | ICD-10-CM | POA: Diagnosis not present

## 2017-10-22 DIAGNOSIS — O10013 Pre-existing essential hypertension complicating pregnancy, third trimester: Secondary | ICD-10-CM | POA: Diagnosis not present

## 2017-10-24 ENCOUNTER — Inpatient Hospital Stay (HOSPITAL_COMMUNITY)
Admission: AD | Admit: 2017-10-24 | Discharge: 2017-10-24 | Disposition: A | Discharge status: Home / Self Care | Payer: Managed Care, Other (non HMO) | Source: Ambulatory Visit | Attending: Obstetrics and Gynecology | Admitting: Obstetrics and Gynecology

## 2017-10-24 ENCOUNTER — Encounter (HOSPITAL_COMMUNITY): Payer: Self-pay | Admitting: Emergency Medicine

## 2017-10-24 DIAGNOSIS — O10913 Unspecified pre-existing hypertension complicating pregnancy, third trimester: Secondary | ICD-10-CM

## 2017-10-24 DIAGNOSIS — Z3A35 35 weeks gestation of pregnancy: Secondary | ICD-10-CM | POA: Diagnosis not present

## 2017-10-24 DIAGNOSIS — N930 Postcoital and contact bleeding: Secondary | ICD-10-CM

## 2017-10-24 DIAGNOSIS — O10013 Pre-existing essential hypertension complicating pregnancy, third trimester: Secondary | ICD-10-CM | POA: Insufficient documentation

## 2017-10-24 DIAGNOSIS — O26853 Spotting complicating pregnancy, third trimester: Secondary | ICD-10-CM | POA: Insufficient documentation

## 2017-10-24 LAB — COMPREHENSIVE METABOLIC PANEL
ALBUMIN: 3.2 g/dL — AB (ref 3.5–5.0)
ALK PHOS: 86 U/L (ref 38–126)
ALT: 13 U/L — ABNORMAL LOW (ref 14–54)
AST: 15 U/L (ref 15–41)
Anion gap: 9 (ref 5–15)
BILIRUBIN TOTAL: 0.4 mg/dL (ref 0.3–1.2)
BUN: 11 mg/dL (ref 6–20)
CALCIUM: 8.1 mg/dL — AB (ref 8.9–10.3)
CO2: 18 mmol/L — ABNORMAL LOW (ref 22–32)
Chloride: 105 mmol/L (ref 101–111)
Creatinine, Ser: 0.42 mg/dL — ABNORMAL LOW (ref 0.44–1.00)
GFR calc non Af Amer: >60 mL/min (ref >60)
Glucose, Bld: 94 mg/dL (ref 65–99)
POTASSIUM: 3.6 mmol/L (ref 3.5–5.1)
Sodium: 132 mmol/L — ABNORMAL LOW (ref 135–145)
TOTAL PROTEIN: 6.6 g/dL (ref 6.5–8.1)

## 2017-10-24 LAB — CBC
HEMATOCRIT: 30.8 % — AB (ref 36.0–46.0)
Hemoglobin: 10.1 g/dL — ABNORMAL LOW (ref 12.0–15.0)
MCH: 28.2 pg (ref 26.0–34.0)
MCHC: 32.8 g/dL (ref 30.0–36.0)
MCV: 86 fL (ref 78.0–100.0)
Platelets: 220 10*3/uL (ref 150–400)
RBC: 3.58 MIL/uL — ABNORMAL LOW (ref 3.87–5.11)
RDW: 14.6 % (ref 11.5–15.5)
WBC: 7.8 10*3/uL (ref 4.0–10.5)

## 2017-10-24 LAB — WET PREP, GENITAL
Clue Cells Wet Prep HPF POC: NONE SEEN
Sperm: NONE SEEN
Trich, Wet Prep: NONE SEEN
YEAST WET PREP: NONE SEEN

## 2017-10-24 LAB — URINALYSIS, ROUTINE W REFLEX MICROSCOPIC
Bilirubin Urine: NEGATIVE
GLUCOSE, UA: NEGATIVE mg/dL
Hgb urine dipstick: NEGATIVE
Ketones, ur: NEGATIVE mg/dL
Leukocytes, UA: NEGATIVE
Nitrite: NEGATIVE
PH: 7 (ref 5.0–8.0)
PROTEIN: NEGATIVE mg/dL
Specific Gravity, Urine: 1.018 (ref 1.005–1.030)

## 2017-10-24 LAB — PROTEIN / CREATININE RATIO, URINE
CREATININE, URINE: 104 mg/dL
PROTEIN CREATININE RATIO: 0.13 mg/mg{creat} (ref 0.00–0.15)
Total Protein, Urine: 14 mg/dL

## 2017-10-24 MED ORDER — LABETALOL HCL 100 MG PO TABS: 150.0000 mg | ORAL_TABLET | Freq: Two times a day (BID) | ORAL | 3 refills | Status: DC

## 2017-10-24 NOTE — MAU Note (Addendum)
Pt complains of spotting when she wipes. +FM  Denies LOF. Denies burning with urination.  Pt complain of pressure in her lower abd.   Pt takes labetalol 100 mg for her BP...she started this medication at the beginning of pregnancy.

## 2017-10-24 NOTE — MAU Provider Note (Signed)
Chief Complaint:  Vaginal Bleeding   First Provider Initiated Contact with Patient 10/24/17 0429      HPI: Christine Mills is a 22 y.o. G2P0010 at 1173w2d who presents to maternity admissions reporting spotting when wiping x 1 this morning. She denies any associated pain, n/v, or other symptoms. She had intercourse last night.  She has not tried any treatments.  [facility] and she takes labetalol 100 mg BID. She denies h/a, epigastric pain, or visual disturbances.     HPI  Past Medical History: Past Medical History:  Diagnosis Date  . Hypertension 03/2017  . Ovarian cyst     Past obstetric history: OB History  Gravida Para Term Preterm AB Living  2 0 0 0 1 0  SAB TAB Ectopic Multiple Live Births  1 0 0 0 0    # Outcome Date GA Lbr Len/2nd Weight Sex Delivery Anes PTL Lv  2 Current           1 SAB 06/2016 [id number]             Past Surgical History: Past Surgical History:  Procedure Laterality Date  . NO PAST SURGERIES      Family History: Family History  Problem Relation Age of Onset  . Hypertension Paternal Grandmother   . Hypertension Paternal Grandfather     Social History: Social History   Tobacco Use  . Smoking status: Never Smoker  . Smokeless tobacco: Never Used  Substance Use Topics  . Alcohol use: No  . Drug use: No    Comment: not currently since pregnancy    Allergies: No Known Allergies  Meds:  No medications prior to admission.    ROS:  Review of Systems  Constitutional: Negative for chills, fatigue and fever.  Eyes: Negative for visual disturbance.  Respiratory: Negative for shortness of breath.   Cardiovascular: Negative for chest pain.  Gastrointestinal: Negative for abdominal pain, nausea and vomiting.  Genitourinary: Positive for vaginal bleeding. Negative for difficulty urinating, dysuria, flank pain, pelvic pain, vaginal discharge and vaginal pain.  Neurological: Negative for dizziness and headaches.   Psychiatric/Behavioral: Negative.      I have reviewed patient's Past Medical Hx, Surgical Hx, Family Hx, Social Hx, medications and allergies.   Physical Exam   Patient Vitals for the past 24 hrs:  BP Temp Temp src Pulse Resp SpO2 Height Weight  10/24/17 0526 125/63 98 F (36.7 C) Oral 91 16 99 % - -  10/24/17 0516 125/63 - - 98 - 100 % - -  10/24/17 0502 118/73 - - (!) 101 - - - -  10/24/17 0439 139/80 - - 100 - - - -  10/24/17 0437 - - - - - 100 % - -  10/24/17 0432 - - - - - 100 % - -  10/24/17 0427 - - - - - 100 % - -  10/24/17 0424 - - - - - 100 % - -  10/24/17 0422 - - - - - 100 % - -  10/24/17 0419 (!) 142/66 - - (!) 103 - - - -  10/24/17 0417 - - - - - 99 % - -  10/24/17 0414 - - - - - 99 % - -  10/24/17 0334 (!) 144/77 98 F (36.7 C) Oral (!) 107 17 98 % 5\' 6"  (1.676 m) 262 lb 12 oz (119.2 kg)  10/24/17 0333 (!) 144/77 - - (!) 104 - - - -   Constitutional: Well-developed, well-nourished  female in no acute distress.  Cardiovascular: normal rate Respiratory: normal effort GI: Abd soft, non-tender, gravid appropriate for gestational age.  MS: Extremities nontender, no edema, normal ROM Neurologic: Alert and oriented x 4.  GU: Neg CVAT.  PELVIC EXAM: scant white creamy discharge, no visible bleeding in vaginal vault, cervix mildly erythematous but not friable to cotton swab, visually closed, vaginal walls and external genitalia normal   Dilation: Closed Effacement (%): Thick Cervical Position: Posterior Exam by:: Leftwich-Kirby, CNM  FHT:  Baseline 145 , moderate variability, accelerations present, no decelerations Contractions: None on toco or to palpation   Labs: Results for orders placed or performed during the hospital encounter of 10/24/17 (from the past 24 hour(s))  Urinalysis, Routine w reflex microscopic     Status: None   Collection Time: 10/24/17  3:19 AM  Result Value Ref Range   Color, Urine YELLOW YELLOW   APPearance CLEAR CLEAR   Specific  Gravity, Urine 1.018 1.005 - 1.030   pH 7.0 5.0 - 8.0   Glucose, UA NEGATIVE NEGATIVE mg/dL   Hgb urine dipstick NEGATIVE NEGATIVE   Bilirubin Urine NEGATIVE NEGATIVE   Ketones, ur NEGATIVE NEGATIVE mg/dL   Protein, ur NEGATIVE NEGATIVE mg/dL   Nitrite NEGATIVE NEGATIVE   Leukocytes, UA NEGATIVE NEGATIVE  Protein / creatinine ratio, urine     Status: None   Collection Time: 10/24/17  3:19 AM  Result Value Ref Range   Creatinine, Urine 104.00 mg/dL   Total Protein, Urine 14 mg/dL   Protein Creatinine Ratio 0.13 0.00 - 0.15 mg/mg[Cre]  Wet prep, genital     Status: Abnormal   Collection Time: 10/24/17  4:30 AM  Result Value Ref Range   Yeast Wet Prep HPF POC NONE SEEN NONE SEEN   Trich, Wet Prep NONE SEEN NONE SEEN   Clue Cells Wet Prep HPF POC NONE SEEN NONE SEEN   WBC, Wet Prep HPF POC MODERATE (A) NONE SEEN   Sperm NONE SEEN   CBC     Status: Abnormal   Collection Time: 10/24/17  4:42 AM  Result Value Ref Range   WBC 7.8 4.0 - 10.5 K/uL   RBC 3.58 (L) 3.87 - 5.11 MIL/uL   Hemoglobin 10.1 (L) 12.0 - 15.0 g/dL   HCT 16.1 (L) 09.6 - 04.5 %   MCV 86.0 78.0 - 100.0 fL   MCH 28.2 26.0 - 34.0 pg   MCHC 32.8 30.0 - 36.0 g/dL   RDW 40.9 81.1 - 91.4 %   Platelets 220 150 - 400 K/uL  Comprehensive metabolic panel     Status: Abnormal   Collection Time: 10/24/17  4:42 AM  Result Value Ref Range   Sodium 132 (L) 135 - 145 mmol/L   Potassium 3.6 3.5 - 5.1 mmol/L   Chloride 105 101 - 111 mmol/L   CO2 18 (L) 22 - 32 mmol/L   Glucose, Bld 94 65 - 99 mg/dL   BUN 11 6 - 20 mg/dL   Creatinine, Ser 7.82 (L) 0.44 - 1.00 mg/dL   Calcium 8.1 (L) 8.9 - 10.3 mg/dL   Total Protein 6.6 6.5 - 8.1 g/dL   Albumin 3.2 (L) 3.5 - 5.0 g/dL   AST 15 15 - 41 U/L   ALT 13 (L) 14 - 54 U/L   Alkaline Phosphatase 86 38 - 126 U/L   Total Bilirubin 0.4 0.3 - 1.2 mg/dL   GFR calc non Af Amer >60 >60 mL/min   GFR calc Af Amer >60 >60  mL/min   Anion gap 9 5 - 15   O/Positive/-- (08/16 1212)  Imaging:     MAU Course/MDM: No evidence of active bleeding on exam today Bleeding was most likely postcoital NST reviewed and reactive Pt with elevated BP in MAU x 3 so preeclampsia labs ordered Platelets, creatinine, liver enzymes wnl, P/C ratio 0.13 so no evidence of preeclampsia today Pt initially hypertensive 140s/70s but last 3-4 BPs all wnl Consult Dr Vergie Living with presentation, exam findings and test results.  Reassurance provided to pt about bleeding, likely cervical bleeding following intercourse Plan to increase labetalol to 150 mg BID but with normal BPs, pt may increase or choose to keep current dose and f/u in office on Tuesday as planned Pt discharge with strict BP and bleeding precautions.   Assessment: 1. Spotting affecting pregnancy in third trimester   2. Maternal chronic hypertension in third trimester   3. Postcoital bleeding     Plan: Discharge home Labor precautions and fetal kick counts Follow-up Information    [facility] Follow up.   Why:  As scheduled, return to MAU as needed for emergencies Contact information: [address] [place] [address] [phone]         Allergies as of 10/24/2017   No Known Allergies     Medication List    TAKE these medications   aspirin EC 81 MG tablet Take 1 tablet (81 mg total) by mouth daily. Take after 12 weeks for prevention of preeclampsia later in pregnancy   CITRANATAL BLOOM 90-1 MG Tabs Take 1 tablet by mouth daily.   COMFORT FIT MATERNITY SUPP LG Misc 1 Units daily by Does not apply route.   labetalol 100 MG tablet Commonly known as:  NORMODYNE Take 1.5 tablets (150 mg total) by mouth 2 (two) times daily. What changed:  how much to take   prenatal multivitamin Tabs tablet Take 1 tablet by mouth daily at 12 noon.   Vitamin D (Ergocalciferol) 50000 units Caps capsule Commonly known as:  DRISDOL Take 1 capsule (50,000 Units total) by mouth every 7 (seven)  days.       Sharen Counter Certified Nurse-Midwife 10/24/2017 5:44 AM

## 2017-10-25 ENCOUNTER — Other Ambulatory Visit (HOSPITAL_COMMUNITY): Payer: Self-pay | Admitting: Obstetrics and Gynecology

## 2017-10-25 DIAGNOSIS — O365930 Maternal care for other known or suspected poor fetal growth, third trimester, not applicable or unspecified: Secondary | ICD-10-CM

## 2017-10-25 DIAGNOSIS — Z3A35 35 weeks gestation of pregnancy: Secondary | ICD-10-CM

## 2017-10-25 DIAGNOSIS — O163 Unspecified maternal hypertension, third trimester: Secondary | ICD-10-CM

## 2017-10-25 LAB — GC/CHLAMYDIA PROBE AMP (~~LOC~~) NOT AT ARMC
CHLAMYDIA, DNA PROBE: NEGATIVE
Neisseria Gonorrhea: NEGATIVE

## 2017-10-26 ENCOUNTER — Encounter: Payer: Self-pay | Admitting: Obstetrics and Gynecology

## 2017-10-26 ENCOUNTER — Ambulatory Visit (INDEPENDENT_AMBULATORY_CARE_PROVIDER_SITE_OTHER): Payer: Managed Care, Other (non HMO) | Admitting: Obstetrics and Gynecology

## 2017-10-26 VITALS — BP 130/70 | HR 114 | Wt 263.2 lb

## 2017-10-26 DIAGNOSIS — O9989 Other specified diseases and conditions complicating pregnancy, childbirth and the puerperium: Secondary | ICD-10-CM

## 2017-10-26 DIAGNOSIS — O10913 Unspecified pre-existing hypertension complicating pregnancy, third trimester: Secondary | ICD-10-CM

## 2017-10-26 DIAGNOSIS — O0990 Supervision of high risk pregnancy, unspecified, unspecified trimester: Secondary | ICD-10-CM

## 2017-10-26 DIAGNOSIS — Z2839 Other underimmunization status: Secondary | ICD-10-CM

## 2017-10-26 DIAGNOSIS — O10919 Unspecified pre-existing hypertension complicating pregnancy, unspecified trimester: Secondary | ICD-10-CM

## 2017-10-26 DIAGNOSIS — O0993 Supervision of high risk pregnancy, unspecified, third trimester: Secondary | ICD-10-CM

## 2017-10-26 DIAGNOSIS — Z283 Underimmunization status: Secondary | ICD-10-CM

## 2017-10-26 NOTE — Patient Instructions (Signed)
Third Trimester of Pregnancy The third trimester is from week 28 through week 40 (months 7 through 9). The third trimester is a time when the unborn baby (fetus) is growing rapidly. At the end of the ninth month, the fetus is about 20 inches in length and weighs 6-10 pounds. Body changes during your third trimester Your body will continue to go through many changes during pregnancy. The changes vary from woman to woman. During the third trimester:  Your weight will continue to increase. You can expect to gain 25-35 pounds (11-16 kg) by the end of the pregnancy.  You may begin to get stretch marks on your hips, abdomen, and breasts.  You may urinate more often because the fetus is moving lower into your pelvis and pressing on your bladder.  You may develop or continue to have heartburn. This is caused by increased hormones that slow down muscles in the digestive tract.  You may develop or continue to have constipation because increased hormones slow digestion and cause the muscles that push waste through your intestines to relax.  You may develop hemorrhoids. These are swollen veins (varicose veins) in the rectum that can itch or be painful.  You may develop swollen, bulging veins (varicose veins) in your legs.  You may have increased body aches in the pelvis, back, or thighs. This is due to weight gain and increased hormones that are relaxing your joints.  You may have changes in your hair. These can include thickening of your hair, rapid growth, and changes in texture. Some women also have hair loss during or after pregnancy, or hair that feels dry or thin. Your hair will most likely return to normal after your baby is born.  Your breasts will continue to grow and they will continue to become tender. A yellow fluid (colostrum) may leak from your breasts. This is the first milk you are producing for your baby.  Your belly button may stick out.  You may notice more swelling in your hands,  face, or ankles.  You may have increased tingling or numbness in your hands, arms, and legs. The skin on your belly may also feel numb.  You may feel short of breath because of your expanding uterus.  You may have more problems sleeping. This can be caused by the size of your belly, increased need to urinate, and an increase in your body's metabolism.  You may notice the fetus "dropping," or moving lower in your abdomen (lightening).  You may have increased vaginal discharge.  You may notice your joints feel loose and you may have pain around your pelvic bone.  What to expect at prenatal visits You will have prenatal exams every 2 weeks until week 36. Then you will have weekly prenatal exams. During a routine prenatal visit:  You will be weighed to make sure you and the baby are growing normally.  Your blood pressure will be taken.  Your abdomen will be measured to track your baby's growth.  The fetal heartbeat will be listened to.  Any test results from the previous visit will be discussed.  You may have a cervical check near your due date to see if your cervix has softened or thinned (effaced).  You will be tested for Group B streptococcus. This happens between 35 and 37 weeks.  Your health care provider may ask you:  What your birth plan is.  How you are feeling.  If you are feeling the baby move.  If you have had   any abnormal symptoms, such as leaking fluid, bleeding, severe headaches, or abdominal cramping.  If you are using any tobacco products, including cigarettes, chewing tobacco, and electronic cigarettes.  If you have any questions.  Other tests or screenings that may be performed during your third trimester include:  Blood tests that check for low iron levels (anemia).  Fetal testing to check the health, activity level, and growth of the fetus. Testing is done if you have certain medical conditions or if there are problems during the  pregnancy.  Nonstress test (NST). This test checks the health of your baby to make sure there are no signs of problems, such as the baby not getting enough oxygen. During this test, a belt is placed around your belly. The baby is made to move, and its heart rate is monitored during movement.  What is false labor? False labor is a condition in which you feel small, irregular tightenings of the muscles in the womb (contractions) that usually go away with rest, changing position, or drinking water. These are called Braxton Hicks contractions. Contractions may last for hours, days, or even weeks before true labor sets in. If contractions come at regular intervals, become more frequent, increase in intensity, or become painful, you should see your health care provider. What are the signs of labor?  Abdominal cramps.  Regular contractions that start at 10 minutes apart and become stronger and more frequent with time.  Contractions that start on the top of the uterus and spread down to the lower abdomen and back.  Increased pelvic pressure and dull back pain.  A watery or bloody mucus discharge that comes from the vagina.  Leaking of amniotic fluid. This is also known as your "water breaking." It could be a slow trickle or a gush. Let your health care provider know if it has a color or strange odor. If you have any of these signs, call your health care provider right away, even if it is before your due date. Follow these instructions at home: Medicines  Follow your health care provider's instructions regarding medicine use. Specific medicines may be either safe or unsafe to take during pregnancy.  Take a prenatal vitamin that contains at least 600 micrograms (mcg) of folic acid.  If you develop constipation, try taking a stool softener if your health care provider approves. Eating and drinking  Eat a balanced diet that includes fresh fruits and vegetables, whole grains, good sources of protein  such as meat, eggs, or tofu, and low-fat dairy. Your health care provider will help you determine the amount of weight gain that is right for you.  Avoid raw meat and uncooked cheese. These carry germs that can cause birth defects in the baby.  If you have low calcium intake from food, talk to your health care provider about whether you should take a daily calcium supplement.  Eat four or five small meals rather than three large meals a day.  Limit foods that are high in fat and processed sugars, such as fried and sweet foods.  To prevent constipation: ? Drink enough fluid to keep your urine clear or pale yellow. ? Eat foods that are high in fiber, such as fresh fruits and vegetables, whole grains, and beans. Activity  Exercise only as directed by your health care provider. Most women can continue their usual exercise routine during pregnancy. Try to exercise for 30 minutes at least 5 days a week. Stop exercising if you experience uterine contractions.  Avoid heavy   lifting.  Do not exercise in extreme heat or humidity, or at high altitudes.  Wear low-heel, comfortable shoes.  Practice good posture.  You may continue to have sex unless your health care provider tells you otherwise. Relieving pain and discomfort  Take frequent breaks and rest with your legs elevated if you have leg cramps or low back pain.  Take warm sitz baths to soothe any pain or discomfort caused by hemorrhoids. Use hemorrhoid cream if your health care provider approves.  Wear a good support bra to prevent discomfort from breast tenderness.  If you develop varicose veins: ? Wear support pantyhose or compression stockings as told by your healthcare provider. ? Elevate your feet for 15 minutes, 3-4 times a day. Prenatal care  Write down your questions. Take them to your prenatal visits.  Keep all your prenatal visits as told by your health care provider. This is important. Safety  Wear your seat belt at  all times when driving.  Make a list of emergency phone numbers, including numbers for family, friends, the hospital, and police and fire departments. General instructions  Avoid cat litter boxes and soil used by cats. These carry germs that can cause birth defects in the baby. If you have a cat, ask someone to clean the litter box for you.  Do not travel far distances unless it is absolutely necessary and only with the approval of your health care provider.  Do not use hot tubs, steam rooms, or saunas.  Do not drink alcohol.  Do not use any products that contain nicotine or tobacco, such as cigarettes and e-cigarettes. If you need help quitting, ask your health care provider.  Do not use any medicinal herbs or unprescribed drugs. These chemicals affect the formation and growth of the baby.  Do not douche or use tampons or scented sanitary pads.  Do not cross your legs for long periods of time.  To prepare for the arrival of your baby: ? Take prenatal classes to understand, practice, and ask questions about labor and delivery. ? Make a trial run to the hospital. ? Visit the hospital and tour the maternity area. ? Arrange for maternity or paternity leave through employers. ? Arrange for family and friends to take care of pets while you are in the hospital. ? Purchase a rear-facing car seat and make sure you know how to install it in your car. ? Pack your hospital bag. ? Prepare the baby's nursery. Make sure to remove all pillows and stuffed animals from the baby's crib to prevent suffocation.  Visit your dentist if you have not gone during your pregnancy. Use a soft toothbrush to brush your teeth and be gentle when you floss. Contact a health care provider if:  You are unsure if you are in labor or if your water has broken.  You become dizzy.  You have mild pelvic cramps, pelvic pressure, or nagging pain in your abdominal area.  You have lower back pain.  You have persistent  nausea, vomiting, or diarrhea.  You have an unusual or bad smelling vaginal discharge.  You have pain when you urinate. Get help right away if:  Your water breaks before 37 weeks.  You have regular contractions less than 5 minutes apart before 37 weeks.  You have a fever.  You are leaking fluid from your vagina.  You have spotting or bleeding from your vagina.  You have severe abdominal pain or cramping.  You have rapid weight loss or weight gain.    You have shortness of breath with chest pain.  You notice sudden or extreme swelling of your face, hands, ankles, feet, or legs.  Your baby makes fewer than 10 movements in 2 hours.  You have severe headaches that do not go away when you take medicine.  You have vision changes. Summary  The third trimester is from week 28 through week 40, months 7 through 9. The third trimester is a time when the unborn baby (fetus) is growing rapidly.  During the third trimester, your discomfort may increase as you and your baby continue to gain weight. You may have abdominal, leg, and back pain, sleeping problems, and an increased need to urinate.  During the third trimester your breasts will keep growing and they will continue to become tender. A yellow fluid (colostrum) may leak from your breasts. This is the first milk you are producing for your baby.  False labor is a condition in which you feel small, irregular tightenings of the muscles in the womb (contractions) that eventually go away. These are called Braxton Hicks contractions. Contractions may last for hours, days, or even weeks before true labor sets in.  Signs of labor can include: abdominal cramps; regular contractions that start at 10 minutes apart and become stronger and more frequent with time; watery or bloody mucus discharge that comes from the vagina; increased pelvic pressure and dull back pain; and leaking of amniotic fluid. This information is not intended to replace advice  given to you by your health care provider. Make sure you discuss any questions you have with your health care provider. Document Released: 09/01/2001 Document Revised: 02/13/2016 Document Reviewed: 11/08/2012 Elsevier Interactive Patient Education  2017 [employer]  

## 2017-10-26 NOTE — Progress Notes (Signed)
NST DX Chronic HTN during Pregnancy

## 2017-10-26 NOTE — Progress Notes (Signed)
Subjective:  Christine Mills is Christine Mills 22 y.o. G2P0010 at [redacted]w[redacted]d being seen today for ongoing prenatal care.  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  Patient reports no complaints.  Contractions: Not present. Vag. Bleeding: None.  Movement: Present. Denies leaking of fluid.   The following portions of the patient's history were reviewed and updated as appropriate: allergies, current medications, past family history, past medical history, past social history, past surgical history and problem list. Problem list updated.  Objective:   Vitals:   10/26/17 0911  BP: 130/70  Pulse: (!) 114  Weight: 263 lb 3.2 oz (119.4 kg)    Fetal Status:     Movement: Present     General:  Alert, oriented and cooperative. Patient is in no acute distress.  Skin: Skin is warm and dry. No rash noted.   Cardiovascular: Normal heart rate noted  Respiratory: Normal respiratory effort, no problems with respiration noted  Abdomen: Soft, gravid, appropriate for gestational age. Pain/Pressure: Absent     Pelvic:  Cervical exam deferred        Extremities: Normal range of motion.  Edema: None  Mental Status: Normal mood and affect. Normal behavior. Normal judgment and thought content.   Urinalysis:      Assessment and Plan:  Pregnancy: G2P0010 at [redacted]w[redacted]d  1. Chronic hypertension during pregnancy, antepartum BP stable RNST today BPP with dopplers Friday Continue with current Labetalol and BASA and weekly antenatal testing - Fetal nonstress test; Future  2. Rubella non-immune status, antepartum Vaccine PP  3. Supervision of high risk pregnancy, antepartum Stable Had some post coital bleeding over the weekend,  resolved GBS and vaginal cultures next OB visit  Preterm labor symptoms and general obstetric precautions including but not limited to vaginal bleeding, contractions, leaking of fluid and fetal movement were reviewed in detail with the patient. Please refer to After Visit Summary for other counseling recommendations.  Return in about 1 week (around 11/02/2017) for OB visit.   Hermina StaggersErvin, Audine Mangione L, MD

## 2017-10-29 ENCOUNTER — Ambulatory Visit (HOSPITAL_COMMUNITY)
Admission: RE | Admit: 2017-10-29 | Discharge: 2017-10-29 | Disposition: A | Discharge status: Home / Self Care | Payer: Managed Care, Other (non HMO) | Source: Ambulatory Visit | Attending: Certified Nurse Midwife | Admitting: Certified Nurse Midwife

## 2017-10-29 ENCOUNTER — Encounter (HOSPITAL_COMMUNITY): Payer: Self-pay

## 2017-10-29 ENCOUNTER — Other Ambulatory Visit (HOSPITAL_COMMUNITY): Payer: Self-pay | Admitting: Obstetrics and Gynecology

## 2017-10-29 DIAGNOSIS — Z7982 Long term (current) use of aspirin: Secondary | ICD-10-CM | POA: Insufficient documentation

## 2017-10-29 DIAGNOSIS — O99213 Obesity complicating pregnancy, third trimester: Secondary | ICD-10-CM

## 2017-10-29 DIAGNOSIS — Z79899 Other long term (current) drug therapy: Secondary | ICD-10-CM | POA: Diagnosis not present

## 2017-10-29 DIAGNOSIS — O10013 Pre-existing essential hypertension complicating pregnancy, third trimester: Secondary | ICD-10-CM | POA: Diagnosis not present

## 2017-10-29 DIAGNOSIS — E669 Obesity, unspecified: Secondary | ICD-10-CM | POA: Diagnosis not present

## 2017-10-29 DIAGNOSIS — Z3A36 36 weeks gestation of pregnancy: Secondary | ICD-10-CM | POA: Diagnosis not present

## 2017-10-29 DIAGNOSIS — O365930 Maternal care for other known or suspected poor fetal growth, third trimester, not applicable or unspecified: Secondary | ICD-10-CM

## 2017-11-02 ENCOUNTER — Encounter: Payer: Self-pay | Admitting: Obstetrics and Gynecology

## 2017-11-02 ENCOUNTER — Other Ambulatory Visit (HOSPITAL_COMMUNITY)
Admission: RE | Admit: 2017-11-02 | Discharge: 2017-11-02 | Disposition: A | Discharge status: Home / Self Care | Payer: Managed Care, Other (non HMO) | Source: Ambulatory Visit | Attending: Obstetrics and Gynecology | Admitting: Obstetrics and Gynecology

## 2017-11-02 ENCOUNTER — Ambulatory Visit (INDEPENDENT_AMBULATORY_CARE_PROVIDER_SITE_OTHER): Payer: Managed Care, Other (non HMO) | Admitting: Obstetrics and Gynecology

## 2017-11-02 VITALS — BP 138/82 | HR 111 | Wt 262.0 lb

## 2017-11-02 DIAGNOSIS — O0990 Supervision of high risk pregnancy, unspecified, unspecified trimester: Secondary | ICD-10-CM | POA: Diagnosis present

## 2017-11-02 DIAGNOSIS — Z3A36 36 weeks gestation of pregnancy: Secondary | ICD-10-CM | POA: Insufficient documentation

## 2017-11-02 DIAGNOSIS — O0993 Supervision of high risk pregnancy, unspecified, third trimester: Secondary | ICD-10-CM

## 2017-11-02 DIAGNOSIS — Z283 Underimmunization status: Secondary | ICD-10-CM

## 2017-11-02 DIAGNOSIS — O10913 Unspecified pre-existing hypertension complicating pregnancy, third trimester: Secondary | ICD-10-CM | POA: Diagnosis not present

## 2017-11-02 DIAGNOSIS — O9989 Other specified diseases and conditions complicating pregnancy, childbirth and the puerperium: Secondary | ICD-10-CM

## 2017-11-02 DIAGNOSIS — O10919 Unspecified pre-existing hypertension complicating pregnancy, unspecified trimester: Secondary | ICD-10-CM

## 2017-11-02 DIAGNOSIS — O09899 Supervision of other high risk pregnancies, unspecified trimester: Secondary | ICD-10-CM

## 2017-11-02 LAB — OB RESULTS CONSOLE GC/CHLAMYDIA: Gonorrhea: NEGATIVE

## 2017-11-02 NOTE — Progress Notes (Signed)
Subjective:  Genevive BiRashanea Koska is a 22 y.o. G2P0010 at 8435w4d being seen today for ongoing prenatal care.  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  Patient reports no complaints.  Contractions: Not present. Vag. Bleeding: None.  Movement: Present. Denies leaking of fluid.   The following portions of the patient's history were reviewed and updated as appropriate: allergies, current medications, past family history, past medical history, past social history, past surgical history and problem list. Problem list updated.  Objective:   Vitals:   11/02/17 0852  BP: 138/82  Pulse: (!) 111  Weight: 262 lb (118.8 kg)    Fetal Status: Fetal Heart Rate (bpm): NST   Movement: Present     General:  Alert, oriented and cooperative. Patient is in no acute distress.  Skin: Skin is warm and dry. No rash noted.   Cardiovascular: Normal heart rate noted  Respiratory: Normal respiratory effort, no problems with respiration noted  Abdomen: Soft, gravid, appropriate for gestational age. Pain/Pressure: Absent     Pelvic:  Cervical exam performed        Extremities: Normal range of motion.     Mental Status: Normal mood and affect. Normal behavior. Normal judgment and thought content.   Urinalysis: Urine Protein: Trace Urine Glucose: Negative  Assessment and Plan:  Pregnancy: G2P0010 at 6535w4d  1. Supervision of high risk pregnancy, antepartum Stable - Strep Gp B NAA - Cervicovaginal ancillary only  2. Rubella non-immune status, antepartum Vaccine PP  3. Chronic hypertension during pregnancy, antepartum BP stable Continue with BASA and Labetalol. RNST today. Continue with antenatal testing - Fetal  nonstress test; Future  Term labor symptoms and general obstetric precautions including but not limited to vaginal bleeding, contractions, leaking of fluid and fetal movement were reviewed in detail with the patient. Please refer to After Visit Summary for other counseling recommendations.  Return in about 1 week (around 11/09/2017) for OB visit.   Hermina StaggersErvin, Rodert Hinch L, MD

## 2017-11-02 NOTE — Progress Notes (Signed)
NST reviewed and noted reactive by Dr Alysia PennaErvin.

## 2017-11-03 LAB — CERVICOVAGINAL ANCILLARY ONLY
CHLAMYDIA, DNA PROBE: NEGATIVE
NEISSERIA GONORRHEA: NEGATIVE

## 2017-11-04 LAB — STREP GP B NAA: STREP GROUP B AG: NEGATIVE

## 2017-11-05 ENCOUNTER — Other Ambulatory Visit (HOSPITAL_COMMUNITY): Payer: Self-pay | Admitting: Obstetrics and Gynecology

## 2017-11-05 ENCOUNTER — Encounter (HOSPITAL_COMMUNITY): Payer: Self-pay

## 2017-11-05 ENCOUNTER — Ambulatory Visit (HOSPITAL_COMMUNITY)
Admission: RE | Admit: 2017-11-05 | Discharge: 2017-11-05 | Disposition: A | Discharge status: Home / Self Care | Payer: Managed Care, Other (non HMO) | Source: Ambulatory Visit | Attending: Certified Nurse Midwife | Admitting: Certified Nurse Midwife

## 2017-11-05 DIAGNOSIS — O10013 Pre-existing essential hypertension complicating pregnancy, third trimester: Secondary | ICD-10-CM | POA: Diagnosis present

## 2017-11-05 DIAGNOSIS — Z3A37 37 weeks gestation of pregnancy: Secondary | ICD-10-CM

## 2017-11-05 DIAGNOSIS — O99213 Obesity complicating pregnancy, third trimester: Secondary | ICD-10-CM | POA: Diagnosis not present

## 2017-11-05 DIAGNOSIS — O365930 Maternal care for other known or suspected poor fetal growth, third trimester, not applicable or unspecified: Secondary | ICD-10-CM

## 2017-11-05 DIAGNOSIS — O10919 Unspecified pre-existing hypertension complicating pregnancy, unspecified trimester: Secondary | ICD-10-CM

## 2017-11-09 ENCOUNTER — Telehealth (HOSPITAL_COMMUNITY): Payer: Self-pay | Admitting: *Deleted

## 2017-11-09 ENCOUNTER — Ambulatory Visit (INDEPENDENT_AMBULATORY_CARE_PROVIDER_SITE_OTHER): Payer: Managed Care, Other (non HMO) | Admitting: Obstetrics & Gynecology

## 2017-11-09 ENCOUNTER — Encounter: Payer: Self-pay | Admitting: Obstetrics & Gynecology

## 2017-11-09 VITALS — BP 125/79 | HR 120 | Wt 269.0 lb

## 2017-11-09 DIAGNOSIS — O0993 Supervision of high risk pregnancy, unspecified, third trimester: Secondary | ICD-10-CM

## 2017-11-09 DIAGNOSIS — O0990 Supervision of high risk pregnancy, unspecified, unspecified trimester: Secondary | ICD-10-CM

## 2017-11-09 DIAGNOSIS — O10913 Unspecified pre-existing hypertension complicating pregnancy, third trimester: Secondary | ICD-10-CM

## 2017-11-09 DIAGNOSIS — O10919 Unspecified pre-existing hypertension complicating pregnancy, unspecified trimester: Secondary | ICD-10-CM

## 2017-11-09 NOTE — Telephone Encounter (Signed)
Preadmission screen  

## 2017-11-09 NOTE — Progress Notes (Signed)
   PRENATAL VISIT NOTE  Subjective:  Genevive BiRashanea Kaser is a 22 y.o. G2P0010 at 7319w4d being seen today for ongoing prenatal care.  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  Patient reports no complaints.  Contractions: Not present. Vag. Bleeding: None.  Movement: Present. Denies leaking of fluid.   The following portions of the patient's history were reviewed and updated as appropriate: allergies, current medications, past family history, past medical history, past social history, past surgical history and problem list. Problem list updated.  Objective:   Vitals:   11/09/17 0907  BP: 125/79  Pulse: (!) 120  Weight: 269 lb (122 kg)    Fetal Status: Fetal Heart Rate (bpm): NST   Movement: Present     General:  Alert, oriented and cooperative. Patient is in no acute distress.  Skin: Skin is warm and dry. No rash noted.   Cardiovascular: Normal heart rate noted  Respiratory: Normal respiratory effort, no problems with respiration noted  Abdomen: Soft, gravid, appropriate for gestational age.  Pain/Pressure: Present     Pelvic: Cervical exam performed        Extremities: Normal range of motion.  Edema: None  Mental Status:  Normal mood and affect. Normal behavior. Normal judgment and thought content.   Assessment and Plan:  Pregnancy: G2P0010 at 8419w4d  1. Supervision of high risk pregnancy, antepartum NST reactive today  2. Chronic hypertension during pregnancy, antepartum Normal BP, IOL 39 weeks - Fetal nonstress test; Future  Term labor symptoms and general obstetric precautions including but not limited to vaginal bleeding, contractions, leaking of fluid and fetal movement were  reviewed in detail with the patient. Please refer to After Visit Summary for other counseling recommendations.  Return in about 1 week (around 11/16/2017) for NST.   Scheryl DarterJames Aneth Schlagel, MD

## 2017-11-12 ENCOUNTER — Other Ambulatory Visit (HOSPITAL_COMMUNITY): Payer: Self-pay | Admitting: Obstetrics and Gynecology

## 2017-11-12 ENCOUNTER — Ambulatory Visit (HOSPITAL_COMMUNITY)
Admission: RE | Admit: 2017-11-12 | Discharge: 2017-11-12 | Disposition: A | Discharge status: Home / Self Care | Payer: Managed Care, Other (non HMO) | Source: Ambulatory Visit | Attending: Certified Nurse Midwife | Admitting: Certified Nurse Midwife

## 2017-11-12 ENCOUNTER — Encounter (HOSPITAL_COMMUNITY): Payer: Self-pay

## 2017-11-12 DIAGNOSIS — Z3A38 38 weeks gestation of pregnancy: Secondary | ICD-10-CM

## 2017-11-12 DIAGNOSIS — O10919 Unspecified pre-existing hypertension complicating pregnancy, unspecified trimester: Secondary | ICD-10-CM

## 2017-11-12 DIAGNOSIS — O09893 Supervision of other high risk pregnancies, third trimester: Secondary | ICD-10-CM | POA: Insufficient documentation

## 2017-11-12 DIAGNOSIS — O10013 Pre-existing essential hypertension complicating pregnancy, third trimester: Secondary | ICD-10-CM | POA: Diagnosis present

## 2017-11-12 DIAGNOSIS — O99213 Obesity complicating pregnancy, third trimester: Secondary | ICD-10-CM | POA: Diagnosis not present

## 2017-11-12 DIAGNOSIS — O365930 Maternal care for other known or suspected poor fetal growth, third trimester, not applicable or unspecified: Secondary | ICD-10-CM

## 2017-11-15 ENCOUNTER — Other Ambulatory Visit: Payer: Self-pay | Admitting: Certified Nurse Midwife

## 2017-11-16 ENCOUNTER — Encounter: Payer: Self-pay | Admitting: Obstetrics & Gynecology

## 2017-11-16 ENCOUNTER — Ambulatory Visit (INDEPENDENT_AMBULATORY_CARE_PROVIDER_SITE_OTHER): Payer: Managed Care, Other (non HMO) | Admitting: Obstetrics & Gynecology

## 2017-11-16 ENCOUNTER — Ambulatory Visit (INDEPENDENT_AMBULATORY_CARE_PROVIDER_SITE_OTHER): Payer: Self-pay | Admitting: Pediatrics

## 2017-11-16 VITALS — BP 135/78 | HR 107 | Wt 270.6 lb

## 2017-11-16 DIAGNOSIS — O10913 Unspecified pre-existing hypertension complicating pregnancy, third trimester: Secondary | ICD-10-CM | POA: Diagnosis not present

## 2017-11-16 DIAGNOSIS — O0993 Supervision of high risk pregnancy, unspecified, third trimester: Secondary | ICD-10-CM

## 2017-11-16 DIAGNOSIS — O99210 Obesity complicating pregnancy, unspecified trimester: Secondary | ICD-10-CM | POA: Insufficient documentation

## 2017-11-16 DIAGNOSIS — Z7681 Expectant parent(s) prebirth pediatrician visit: Secondary | ICD-10-CM

## 2017-11-16 DIAGNOSIS — O10919 Unspecified pre-existing hypertension complicating pregnancy, unspecified trimester: Secondary | ICD-10-CM

## 2017-11-16 DIAGNOSIS — E669 Obesity, unspecified: Secondary | ICD-10-CM

## 2017-11-16 DIAGNOSIS — O0990 Supervision of high risk pregnancy, unspecified, unspecified trimester: Secondary | ICD-10-CM

## 2017-11-16 DIAGNOSIS — O99213 Obesity complicating pregnancy, third trimester: Secondary | ICD-10-CM

## 2017-11-16 NOTE — Progress Notes (Signed)
   PRENATAL VISIT NOTE  Subjective:  Christine Mills is a 22 y.o. G2P0010 at [redacted]w[redacted]d being seen today for ongoing prenatal care.  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  Patient reports no complaints.  Contractions: Not present. Vag. Bleeding: None.  Movement: Present. Denies leaking of fluid.   The following portions of the patient's history were reviewed and updated as appropriate: allergies, current medications, past family history, past medical history, past social history, past surgical history and problem list. Problem list updated.  Objective:   Vitals:   11/16/17 0909  BP: 135/78  Pulse: (!) 107  Weight: 270 lb 9.6 oz (122.7 kg)    Fetal Status: Fetal Heart Rate (bpm): NST   Movement: Present     General:  Alert, oriented and cooperative. Patient is in no acute distress.  Skin: Skin is warm and dry. No rash noted.   Cardiovascular: Normal heart rate noted  Respiratory: Normal respiratory effort, no problems with respiration noted  Abdomen: Soft, gravid, appropriate for gestational age.  Pain/Pressure: Present     Pelvic: Cervical exam performed        Extremities: Normal range of motion.  Edema: None  Mental Status:  Normal mood and affect. Normal behavior. Normal judgment and thought content.   Assessment and Plan:  Pregnancy: G2P0010 at 2364w4d  1. Chronic hypertension during pregnancy, antepartum -IOL this Friday - I stressed that this may be a several day IOL - Fetal nonstress test  2. Supervision of high risk pregnancy, antepartum  3. Obesity in pregnancy  Term labor symptoms and general obstetric precautions including but not limited to  vaginal bleeding, contractions, leaking of fluid and fetal movement were reviewed in detail with the patient. Please refer to After Visit Summary for other counseling recommendations.  Return in about 5 weeks (around 12/21/2017) for postpartum visit.   Allie BossierMyra C Rexann Lueras, MD

## 2017-11-17 NOTE — Progress Notes (Signed)
Prenatal counseling for impending newborn done-- 1st child, currently 38wks, Gest HTN, prenatal 8wks, Induce 11/19/17 Z76.81

## 2017-11-18 NOTE — H&P (Signed)
Christine Mills is a 22 y.o. female G2P0010 with IUP at 71 weeks presenting for IOL for [facility]. [facility] at [facility]  Prenatal History/Complications:  CHTN, on labetalol  BID    Past Medical History: Past Medical History:  Diagnosis Date  . Anemia   . Hypertension 03/2017  . Ovarian cyst     Past Surgical History: Past Surgical History:  Procedure Laterality Date  . NO PAST SURGERIES      Obstetrical History: OB History    Gravida Para Term Preterm AB Living   2 0 0 0 1 0   SAB TAB Ectopic Multiple Live Births   1 0 0 0 0      Social History: Social History   Socioeconomic History  . Marital status: Single    Spouse name: Not on file  . Number of children: Not on file  . Years of education: Not on file  . Highest education level: Not on file  Social Needs  . Financial resource strain: Not on file  . Food insecurity - worry: Not on file  . Food insecurity - inability: Not on file  . Transportation needs - medical: Not on file  . Transportation needs - non-medical: Not on file  Occupational History  . Not on file  Tobacco Use  . Smoking status: Never Smoker  . Smokeless tobacco: Never Used  Substance and Sexual Activity  . Alcohol use: No  . Drug use: No    Comment: not currently since pregnancy  . Sexual activity: Not Currently    Partners: Male    Birth control/protection: None  Other Topics Concern  . Not on file  Social History Narrative  . Not on file    Family History: Family History  Problem Relation Age of Onset  . Hypertension Paternal Grandmother   . Hypertension Paternal Grandfather     Allergies: No Known Allergies  Medications Prior to Admission  Medication Sig Dispense Refill Last Dose  . labetalol (NORMODYNE) 100 MG tablet Take 1.5 tablets (150 mg total) by mouth 2 (two) times daily. 60 tablet 3 11/18/2017 at Unknown time  . Prenatal Vit-Fe Fumarate-FA (PRENATAL MULTIVITAMIN) TABS tablet Take 1 tablet by mouth daily at 12  noon.   11/18/2017 at Unknown time  . aspirin EC 81 MG tablet Take 1 tablet (81 mg total) by mouth daily. Take after 12 weeks for prevention of preeclampsia later in pregnancy (Patient not taking: Reported on 11/16/2017) 30 tablet 5 More than a month at Unknown time  . Elastic Bandages & Supports (COMFORT FIT MATERNITY SUPP LG) MISC 1 Units daily by Does not apply route. 1 each 0 Taking  . Prenatal-DSS-FeCb-FeGl-FA (CITRANATAL BLOOM) 90-1 MG TABS Take 1 tablet by mouth daily. 30 tablet 12 Taking  . Vitamin D, Ergocalciferol, (DRISDOL) 50000 units CAPS capsule Take 1 capsule (50,000 Units total) by mouth every 7 (seven) days. (Patient not taking: Reported on 11/16/2017) 30 capsule 2 Not Taking        Review of Systems   Constitutional: Negative for fever and chills Eyes: Negative for visual disturbances Respiratory: Negative for shortness of breath, dyspnea Cardiovascular: Negative for chest pain or palpitations  Gastrointestinal: Negative for abdominal pain, vomiting, diarrhea and constipation.   Genitourinary: Negative for dysuria and urgency Musculoskeletal: Negative for back pain, joint pain, myalgias  Neurological: Negative for dizziness and headaches   Clinic [facility] Prenatal Labs  Dating LMP Blood type: O/Positive/-- (08/16 1212)   Genetic Screen NT scan: normal  AFP:      Antibody:Negative (08/16 1212)  Anatomic [place]  normal  Rubella: <0.90 (08/16 1212)  GTT   Third trimester: negative RPR: Non Reactive (12/06 1027)   Flu vaccine  06/03/17 HBsAg: Negative (08/16 1212)   TDaP vaccine   08/26/17                   HIV: Non Reactive (12/06 1027) NR  Baby Food Breast                                             ZOX:WRUEAVWU (02/12 0940)(For PCN allergy, check sensitivities)  Contraception Undecided 10-06-17, reviewed info 1/22, undecided JWJ:XBJY: colpo postpartum  Circumcision Girl    Pediatrician [facility] CF:Neg  Support Person Jola Schmidt SMA: neg   Prenatal Classes no Hgb electrophoresis:Normal       Last menstrual period 02/19/2017, unknown if currently breastfeeding. General appearance: alert, cooperative and no distress Lungs: clear to auscultation bilaterally Heart: regular rate and rhythm Abdomen: soft, non-tender; bowel sounds normal Extremities: Homans sign is negative, no sign of DVT DTR's 2+ Presentation: cephalic Fetal monitoring  Baseline: 150 bpm, Variability: Good {> 6 bpm), Accelerations: Reactive and Decelerations: Absent Uterine activity  None      Prenatal labs: ABO, Rh: O/Positive/-- (08/16 1212) Antibody: Negative (08/16 1212) Rubella: immune RPR: Non Reactive (12/06 1027)  HBsAg: Negative (08/16 1212)  HIV: Non Reactive (12/06 1027)  GBS: Negative (02/12 0940)    Prenatal Transfer Tool  Maternal Diabetes: No Genetic Screening: Normal Maternal Ultrasounds/Referrals: Normal Fetal Ultrasounds or other Referrals:  None Maternal Substance Abuse:  No Significant Maternal Medications:  None Significant Maternal Lab Results: Lab values include: Group B Strep negative    No results found for this or any previous visit (from the past 24 hour(s)).  Assessment: Christine Mills is a 22 y.o. G2P0010 with an IUP at [redacted]w[redacted]d presenting for IOL for [facility] on meds.  Plan: #Labor: Cytotec->Foley->pitocin #Pain:  Per request #FWB Cat 1    Jacklyn Shell 11/19/2017, 1:19 AM

## 2017-11-18 NOTE — H&P (Deleted)
  The note originally documented on this encounter has been moved the the encounter in which it belongs.  

## 2017-11-19 ENCOUNTER — Inpatient Hospital Stay (HOSPITAL_COMMUNITY)
Admission: RE | Admit: 2017-11-19 | Discharge: 2017-11-22 | DRG: 807 | Disposition: A | Discharge status: Home / Self Care | Payer: Managed Care, Other (non HMO) | Source: Ambulatory Visit | Attending: Obstetrics and Gynecology | Admitting: Obstetrics and Gynecology

## 2017-11-19 ENCOUNTER — Inpatient Hospital Stay (HOSPITAL_COMMUNITY)
Admission: RE | Admit: 2017-11-19 | Payer: Managed Care, Other (non HMO) | Source: Ambulatory Visit | Admitting: Obstetrics and Gynecology

## 2017-11-19 ENCOUNTER — Encounter (HOSPITAL_COMMUNITY): Payer: Self-pay | Admitting: Emergency Medicine

## 2017-11-19 ENCOUNTER — Other Ambulatory Visit (HOSPITAL_COMMUNITY): Payer: Managed Care, Other (non HMO)

## 2017-11-19 ENCOUNTER — Other Ambulatory Visit: Payer: Self-pay

## 2017-11-19 DIAGNOSIS — O99214 Obesity complicating childbirth: Secondary | ICD-10-CM | POA: Diagnosis present

## 2017-11-19 DIAGNOSIS — Z7982 Long term (current) use of aspirin: Secondary | ICD-10-CM

## 2017-11-19 DIAGNOSIS — D649 Anemia, unspecified: Secondary | ICD-10-CM | POA: Diagnosis present

## 2017-11-19 DIAGNOSIS — O1002 Pre-existing essential hypertension complicating childbirth: Secondary | ICD-10-CM | POA: Diagnosis present

## 2017-11-19 DIAGNOSIS — O9902 Anemia complicating childbirth: Secondary | ICD-10-CM | POA: Diagnosis present

## 2017-11-19 DIAGNOSIS — Z3A39 39 weeks gestation of pregnancy: Secondary | ICD-10-CM

## 2017-11-19 DIAGNOSIS — O10919 Unspecified pre-existing hypertension complicating pregnancy, unspecified trimester: Secondary | ICD-10-CM | POA: Diagnosis present

## 2017-11-19 DIAGNOSIS — O1092 Unspecified pre-existing hypertension complicating childbirth: Secondary | ICD-10-CM | POA: Diagnosis not present

## 2017-11-19 HISTORY — DX: Anemia, unspecified: D64.9

## 2017-11-19 LAB — TYPE AND SCREEN
ABO/RH(D): O POS
Antibody Screen: NEGATIVE

## 2017-11-19 LAB — ABO/RH: ABO/RH(D): O POS

## 2017-11-19 LAB — CBC
HCT: 32.2 % — ABNORMAL LOW (ref 36.0–46.0)
Hemoglobin: 10.5 g/dL — ABNORMAL LOW (ref 12.0–15.0)
MCH: 28.4 pg (ref 26.0–34.0)
MCHC: 32.6 g/dL (ref 30.0–36.0)
MCV: 87 fL (ref 78.0–100.0)
Platelets: 232 10*3/uL (ref 150–400)
RBC: 3.7 MIL/uL — ABNORMAL LOW (ref 3.87–5.11)
RDW: 15.6 % — ABNORMAL HIGH (ref 11.5–15.5)
WBC: 7.1 10*3/uL (ref 4.0–10.5)

## 2017-11-19 LAB — PROTEIN / CREATININE RATIO, URINE
Creatinine, Urine: 149 mg/dL
Protein Creatinine Ratio: 0.16 mg/mg{Cre} — ABNORMAL HIGH (ref 0.00–0.15)
Total Protein, Urine: 24 mg/dL

## 2017-11-19 LAB — RPR: RPR Ser Ql: NONREACTIVE

## 2017-11-19 MED ORDER — TERBUTALINE SULFATE 1 MG/ML IJ SOLN
0.2500 mg | Freq: Once | INTRAMUSCULAR | Status: DC | PRN
  Filled 2017-11-19: qty 1

## 2017-11-19 MED ORDER — LACTATED RINGERS IV SOLN
INTRAVENOUS | Status: DC
  Administered 2017-11-19 – 2017-11-21 (×9): via INTRAVENOUS

## 2017-11-19 MED ORDER — MISOPROSTOL 25 MCG QUARTER TABLET
25.0000 ug | ORAL_TABLET | ORAL | Status: DC
  Administered 2017-11-19 (×3): 25 ug via VAGINAL
  Filled 2017-11-19 (×3): qty 1

## 2017-11-19 MED ORDER — LACTATED RINGERS IV SOLN
500.0000 mL | INTRAVENOUS | Status: DC | PRN
  Administered 2017-11-20: 300 mL via INTRAVENOUS
  Administered 2017-11-20: 1000 mL via INTRAVENOUS

## 2017-11-19 MED ORDER — OXYTOCIN 40 UNITS IN LACTATED RINGERS INFUSION - SIMPLE MED
2.5000 [IU]/h | INTRAVENOUS | Status: DC
  Administered 2017-11-21: 2.5 [IU]/h via INTRAVENOUS
  Filled 2017-11-19: qty 1000

## 2017-11-19 MED ORDER — ACETAMINOPHEN 325 MG PO TABS: 650.0000 mg | ORAL_TABLET | ORAL | Status: DC | PRN

## 2017-11-19 MED ORDER — LIDOCAINE HCL (PF) 1 % IJ SOLN
30.0000 mL | INTRAMUSCULAR | Status: DC | PRN
  Filled 2017-11-19: qty 30

## 2017-11-19 MED ORDER — OXYCODONE-ACETAMINOPHEN 5-325 MG PO TABS: 2.0000 | ORAL_TABLET | ORAL | Status: DC | PRN

## 2017-11-19 MED ORDER — FENTANYL CITRATE (PF) 100 MCG/2ML IJ SOLN
100.0000 ug | INTRAMUSCULAR | Status: DC | PRN
  Administered 2017-11-19 – 2017-11-20 (×4): 100 ug via INTRAVENOUS
  Filled 2017-11-19 (×4): qty 2

## 2017-11-19 MED ORDER — ONDANSETRON HCL 4 MG/2ML IJ SOLN
4.0000 mg | Freq: Four times a day (QID) | INTRAMUSCULAR | Status: DC | PRN
  Administered 2017-11-20: 4 mg via INTRAVENOUS
  Filled 2017-11-19: qty 2

## 2017-11-19 MED ORDER — OXYTOCIN BOLUS FROM INFUSION
500.0000 mL | Freq: Once | INTRAVENOUS | Status: AC
  Administered 2017-11-21: 500 mL via INTRAVENOUS

## 2017-11-19 MED ORDER — SOD CITRATE-CITRIC ACID 500-334 MG/5ML PO SOLN: 30.0000 mL | ORAL | Status: DC | PRN

## 2017-11-19 MED ORDER — LABETALOL HCL 100 MG PO TABS
150.0000 mg | ORAL_TABLET | Freq: Two times a day (BID) | ORAL | Status: DC
  Administered 2017-11-19 – 2017-11-20 (×3): 150 mg via ORAL
  Filled 2017-11-19 (×3): qty 2

## 2017-11-19 MED ORDER — MISOPROSTOL 50MCG HALF TABLET
50.0000 ug | ORAL_TABLET | ORAL | Status: DC
  Administered 2017-11-19 (×3): 50 ug via BUCCAL
  Filled 2017-11-19 (×4): qty 1

## 2017-11-19 MED ORDER — OXYCODONE-ACETAMINOPHEN 5-325 MG PO TABS: 1.0000 | ORAL_TABLET | ORAL | Status: DC | PRN

## 2017-11-19 NOTE — Progress Notes (Signed)
Labor Progress Note Christine Mills is a 22 y.o. G2P0010 at 331w0d presented for IOL for cHTN S: Patient has no complaints  O:  BP (!) 148/71   Pulse 94   Temp 98.4 F (36.9 C) (Oral)   Resp 18   Ht 5\' 6"  (1.676 m)   Wt 123.6 kg (272 lb 6.4 oz)   LMP 02/19/2017   SpO2 99%   BMI 43.97 kg/m  EFM: 150 bpm/mod var/pos acels  CVE: Dilation: 1 Effacement (%): Thick Cervical Position: Posterior Station: -3 Presentation: Vertex Exam by:: Dr. Rachelle HoraMoss, MD   A&P: 22 y.o. G2P0010 2331w0d here for IOL for cHTN #Labor: FB placed successfully. Continue cytotec q4hr prn for cervical ripening #Pain: IV pain meds #FWB: cat 1 #cHTN: blood pressures 140s/70s. Patient is taking her home does of labetalol.   Rolm BookbinderAmber Willmar Stockinger, DO 10:48 PM

## 2017-11-19 NOTE — Progress Notes (Signed)
Patient ID: Christine Mills, female   DOB: [birth year], 22 y.o.   MRN: [id number]  Mostly comfortable; s/p cytotec x 4  BP 143/73, other VSS FHR 140s, +accels, no decels Cx post/FT/50/-3  IUP@term  cHTN Cx unfavorable  Attempt at cervical foley unsuccessful; will give buccal cytotec  Cam HaiSHAW, KIMBERLY CNM 11/19/2017 7:11 PM

## 2017-11-19 NOTE — Progress Notes (Signed)
Patient ID: Christine Mills, female   DOB: [birth year], 22 y.o.   MRN: [id number]  Pt feeling a little crampy, but otherwise fine; s/p vag cyto x 3 doses  BP 121/67, other VSS FHR 135-140s, +accels, no decels, +LTV Irreg ctx Cx FT per RN  IUP@term  cHTN Cx unfavorable  Given buccal cyto; will reeval in 4 hrs  Cam HaiSHAW, Esai Stecklein CNM 11/19/2017  2:44 PM

## 2017-11-19 NOTE — Progress Notes (Signed)
Labor Progress Note Christine Mills is a 22 y.o. G2P0010 at 8869w0d presented for IOL for CHTN.  S: Patient comfortable.  O:  BP (!) 147/61   Pulse 95   Temp 98.4 F (36.9 C) (Oral)   Resp 20   Ht 5\' 6"  (1.676 m)   Wt 272 lb 6.4 oz (123.6 kg)   LMP 02/19/2017   SpO2 99%   BMI 43.97 kg/m   ZOX:WRUEAVWUFHT:baseline rate 150, moderate variability, + acels, no decels Toco: ctx every 2 min, irregular  CVE: Dilation: Fingertip Effacement (%): Thick Cervical Position: Posterior Station: -3 Presentation: Vertex Exam by:: Dr Linwood Dibblesumball RN  A&P: 22 y.o. G2P0010 4469w0d here for IOL for CHTN. #Labor: Minimal change after one dose Cytotec. Placed 2nd dose. Plan to place FB when more dilated. #Pain: per patient request #FWB: Cat I #GBS negative  #CHTN: BP not in severe range, asymptomatic. Continue to monitor.  Ellwood DenseAlison Keelin Sheridan, DO 10:09 AM

## 2017-11-19 NOTE — Anesthesia Pain Management Evaluation Note (Signed)
  CRNA Pain Management Visit Note  Patient: Christine Mills, 22 y.o., female  "Hello I am a [facility]. We have an anesthesia team available at all times to provide care throughout the hospital, including epidural management and anesthesia for C-section. I don't know your plan for the delivery whether it a natural birth, water birth, IV sedation, nitrous supplementation, doula or epidural, but we want to meet your pain goals."   1.Was your pain managed to your expectations on prior hospitalizations?   Yes   2.What is your expectation for pain management during this hospitalization?     Epidural  3.How can we help you reach that goal? epidural  Record the patient's initial score and the patient's pain goal.   Pain: 5  Pain Goal: 5 [facility] wants you to be able to say your pain was always managed very well.  Niyati Heinke 11/19/2017

## 2017-11-20 ENCOUNTER — Inpatient Hospital Stay (HOSPITAL_COMMUNITY): Payer: Managed Care, Other (non HMO) | Admitting: Anesthesiology

## 2017-11-20 LAB — CBC
HCT: 33 % — ABNORMAL LOW (ref 36.0–46.0)
HEMOGLOBIN: 10.8 g/dL — AB (ref 12.0–15.0)
MCH: 28.1 pg (ref 26.0–34.0)
MCHC: 32.7 g/dL (ref 30.0–36.0)
MCV: 85.9 fL (ref 78.0–100.0)
PLATELETS: 203 10*3/uL (ref 150–400)
RBC: 3.84 MIL/uL — AB (ref 3.87–5.11)
RDW: 15.2 % (ref 11.5–15.5)
WBC: 8.3 10*3/uL (ref 4.0–10.5)

## 2017-11-20 MED ORDER — PHENYLEPHRINE 40 MCG/ML (10ML) SYRINGE FOR IV PUSH (FOR BLOOD PRESSURE SUPPORT)
80.0000 ug | PREFILLED_SYRINGE | INTRAVENOUS | Status: DC | PRN
  Filled 2017-11-20: qty 10
  Filled 2017-11-20: qty 5

## 2017-11-20 MED ORDER — FENTANYL 2.5 MCG/ML BUPIVACAINE 1/10 % EPIDURAL INFUSION (WH - ANES)
14.0000 mL/h | INTRAMUSCULAR | Status: DC | PRN
  Administered 2017-11-20 – 2017-11-21 (×2): 14 mL/h via EPIDURAL
  Filled 2017-11-20 (×2): qty 100

## 2017-11-20 MED ORDER — OXYTOCIN 40 UNITS IN LACTATED RINGERS INFUSION - SIMPLE MED
1.0000 m[IU]/min | INTRAVENOUS | Status: DC
  Administered 2017-11-20: 2 m[IU]/min via INTRAVENOUS
  Filled 2017-11-20: qty 1000

## 2017-11-20 MED ORDER — LACTATED RINGERS IV SOLN
500.0000 mL | Freq: Once | INTRAVENOUS | Status: AC
  Administered 2017-11-20: 500 mL via INTRAVENOUS

## 2017-11-20 MED ORDER — DIPHENHYDRAMINE HCL 50 MG/ML IJ SOLN: 12.5000 mg | INTRAMUSCULAR | Status: DC | PRN

## 2017-11-20 MED ORDER — PHENYLEPHRINE 40 MCG/ML (10ML) SYRINGE FOR IV PUSH (FOR BLOOD PRESSURE SUPPORT)
80.0000 ug | PREFILLED_SYRINGE | INTRAVENOUS | Status: DC | PRN
  Filled 2017-11-20: qty 5

## 2017-11-20 MED ORDER — LACTATED RINGERS IV SOLN: 500.0000 mL | Freq: Once | INTRAVENOUS | Status: DC

## 2017-11-20 MED ORDER — EPHEDRINE 5 MG/ML INJ
10.0000 mg | INTRAVENOUS | Status: DC | PRN
Start: 2017-11-20 — End: 2017-11-21
  Filled 2017-11-20: qty 2

## 2017-11-20 MED ORDER — TERBUTALINE SULFATE 1 MG/ML IJ SOLN
0.2500 mg | Freq: Once | INTRAMUSCULAR | Status: DC | PRN
  Filled 2017-11-20: qty 1

## 2017-11-20 MED ORDER — LIDOCAINE HCL (PF) 1 % IJ SOLN
INTRAMUSCULAR | Status: DC | PRN
  Administered 2017-11-20: 3 mL via EPIDURAL
  Administered 2017-11-20: 2 mL via EPIDURAL
  Administered 2017-11-20: 5 mL via EPIDURAL

## 2017-11-20 MED ORDER — EPHEDRINE 5 MG/ML INJ
10.0000 mg | INTRAVENOUS | Status: DC | PRN
  Filled 2017-11-20: qty 2

## 2017-11-20 NOTE — Progress Notes (Addendum)
Christine Mills is a 22 y.o. G2P0010 at 2421w1d by LMP admitted for induction of labor due to [place].  Subjective: Pt states she is comfortable, family in room for support.  Objective: BP (!) 112/57   Pulse 90   Temp 97.9 F (36.6 C) (Oral)   Resp 16   Ht 5\' 6"  (1.676 m)   Wt 123.6 kg (272 lb 6.4 oz)   LMP 02/19/2017   SpO2 99%   BMI 43.97 kg/m  No intake/output data recorded. No intake/output data recorded.  FHT:  FHR: 145 bpm, variability: moderate,  accelerations:  Present,  decelerations:  Absent UC:   regular, every 2-3 minutes SVE:   Dilation: 4.5 Effacement (%): 80 Station: -3 Exam by:: Lahoma Crockeraylor Summers, RN   Labs: Lab Results  Component Value Date   WBC 7.1 11/19/2017   HGB 10.5 (L) 11/19/2017   HCT 32.2 (L) 11/19/2017   MCV 87.0 11/19/2017   PLT 232 11/19/2017    Assessment / Plan: Induction of labor due to cHTN,  progressing well on pitocin  Labor: Progressing on Pitocin, will continue to increase then AROM Preeclampsia:  n/a Fetal Wellbeing:  Category I Pain Control:  Epidural I/D:  n/a Anticipated MOD:  NSVD  Swiyyah O Harrington 11/20/2017, 9:19 AM   CNM attestation:  I have seen and examined this patient; I agree with above documentation in the midwife student's note.   Christine Mills is a 22 y.o. G2P0010 with IOL for CHTN   PE: BP 131/67   Pulse 94   Temp 98.6 F (37 C) (Axillary)   Resp 16   Ht 5\' 6"  (1.676 m)   Wt 272 lb 6.4 oz (123.6 kg)   LMP 02/19/2017   SpO2 99%   BMI 43.97 kg/m  Gen: calm comfortable, NAD Resp: normal effort, no distress Abd: gravid  ROS, labs, PMH reviewed Category I FHR tracing, baseline 145 with accels, no decels, moderate variabilty. Regular contractions on toco, every 2-3 minutes  Plan: Continue Pitocin as ordered, titrate PRN per protocol Anticipate NSVD  Sharen CounterLisa Leftwich-Kirby, CNM 12:21 PM

## 2017-11-20 NOTE — Progress Notes (Signed)
Labor Progress Note Genevive BiRashanea Burkley is a 22 y.o. G2P0010 at 6127w1d presented for IOL for cHTN S: No complaints  O:  BP (!) 99/52   Pulse 84   Temp 98.5 F (36.9 C) (Oral)   Resp 18   Ht 5\' 6"  (1.676 m)   Wt 123.6 kg (272 lb 6.4 oz)   LMP 02/19/2017   SpO2 99%   BMI 43.97 kg/m  EFM: 150 bpm/mod var/no decels  CVE: Dilation: 4.5 Effacement (%): 80 Cervical Position: Posterior Station: -3 Presentation: Vertex Exam by:: Dr. Rachelle HoraMoss, MD   A&P: 22 y.o. G2P0010 10127w1d here for IOL for cHTN #Labor: FB out. Start pitocin.   Yusuf Yu, DO 3:29 AM

## 2017-11-20 NOTE — Progress Notes (Addendum)
Christine Mills is a 22 y.o. G2P0010 at 7510w1d by LMP admitted for induction of labor due to [place].  Subjective: Pt reports more painful contractions. Considering epidural soon.  Objective: BP (!) 120/48   Pulse 77   Temp 98.6 F (37 C) (Axillary)   Resp 16   Ht 5\' 6"  (1.676 m)   Wt 123.6 kg (272 lb 6.4 oz)   LMP 02/19/2017   SpO2 99%   BMI 43.97 kg/m  No intake/output data recorded. No intake/output data recorded.  FHT:  FHR: 140 bpm, variability: moderate,  accelerations:  Present,  decelerations:  Absent UC:   regular, every 2-3 minutes SVE:   Dilation: 5 Effacement (%): 80 Station: -1 Exam by:: Swiyyah CNM  Labs: Lab Results  Component Value Date   WBC 7.1 11/19/2017   HGB 10.5 (L) 11/19/2017   HCT 32.2 (L) 11/19/2017   MCV 87.0 11/19/2017   PLT 232 11/19/2017    Assessment / Plan: Induction of labor due to cHTN,  progressing well on pitocin AROM-clear fluids, IUPC placed Labor: Progressing normally Preeclampsia:  n/a Fetal Wellbeing:  Category I Pain Control:  plans epidural soon I/D:  n/a Anticipated MOD:  NSVD  Swiyyah O Harrington 11/20/2017, 3:58 PM  CNM attestation:  I have seen and examined this patient; I agree with above documentation in the midwife student's note.   Christine Mills is a 22 y.o. G2P0010 @[redacted]w[redacted]d  with IOL for CHTN   PE: BP 130/89 (BP Location: Right Arm)   Pulse 87   Temp 97.6 F (36.4 C) (Oral)   Resp 16   Ht 5\' 6"  (1.676 m)   Wt 272 lb 6.4 oz (123.6 kg)   LMP 02/19/2017   SpO2 99%   BMI 43.97 kg/m  Gen: calm comfortable, NAD Resp: normal effort, no distress Abd: gravid  ROS, labs, PMH reviewed Category I FHR tracing FHT:  FHR: 140 bpm, variability: moderate,  accelerations:  Present,  decelerations:  Absent UC:   regular, every 2-3 minutes  AROM with copious amount of clear fluid, pt tolerated well  Plan: Continue Pitocin Monitor IUPC for adequacy of contractions  Sharen CounterLisa Leftwich-Kirby, CNM 4:28 PM

## 2017-11-20 NOTE — Anesthesia Preprocedure Evaluation (Signed)
Anesthesia Evaluation  Patient identified by MRN, date of birth, ID band Patient awake    Reviewed: Allergy & Precautions, NPO status , Patient's Chart, lab work & pertinent test results, reviewed documented beta blocker date and time   Airway Mallampati: III  TM Distance: >3 FB Neck ROM: Full    Dental  (+) Teeth Intact, Dental Advisory Given   Pulmonary neg pulmonary ROS,    Pulmonary exam normal breath sounds clear to auscultation       Cardiovascular hypertension (PIH), Pt. on home beta blockers Normal cardiovascular exam Rhythm:Regular Rate:Normal     Neuro/Psych negative neurological ROS     GI/Hepatic negative GI ROS, Neg liver ROS,   Endo/Other  Morbid obesity  Renal/GU negative Renal ROS     Musculoskeletal negative musculoskeletal ROS (+)   Abdominal   Peds  Hematology  (+) Blood dyscrasia, anemia , Plt 203k    Anesthesia Other Findings Day of surgery medications reviewed with the patient.  Reproductive/Obstetrics (+) Pregnancy                             Anesthesia Physical Anesthesia Plan  ASA: III  Anesthesia Plan: Epidural   Post-op Pain Management:    Induction:   PONV Risk Score and Plan: 2 and Treatment may vary due to age or medical condition  Airway Management Planned:   Additional Equipment:   Intra-op Plan:   Post-operative Plan:   Informed Consent: I have reviewed the patients History and Physical, chart, labs and discussed the procedure including the risks, benefits and alternatives for the proposed anesthesia with the patient or authorized representative who has indicated his/her understanding and acceptance.   Dental advisory given  Plan Discussed with:   Anesthesia Plan Comments: (Patient identified. Risks/Benefits/Options discussed with patient including but not limited to bleeding, infection, nerve damage, paralysis, failed block, incomplete  pain control, headache, blood pressure changes, nausea, vomiting, reactions to medication both or allergic, itching and postpartum back pain. Confirmed with bedside nurse the patient's most recent platelet count. Confirmed with patient that they are not currently taking any anticoagulation, have any bleeding history or any family history of bleeding disorders. Patient expressed understanding and wished to proceed. All questions were answered. )        Anesthesia Quick Evaluation

## 2017-11-20 NOTE — Progress Notes (Addendum)
Genevive BiRashanea Mineau is a 22 y.o. G2P0010 at 1641w1d by LMP admitted for induction of labor due to [place].  Subjective:  Resting comfortably  Objective: BP 131/67   Pulse 94   Temp 98.6 F (37 C) (Axillary)   Resp 16   Ht 5\' 6"  (1.676 m)   Wt 123.6 kg (272 lb 6.4 oz)   LMP 02/19/2017   SpO2 99%   BMI 43.97 kg/m  No intake/output data recorded. No intake/output data recorded.  FHT:  FHR: 150 bpm, variability: moderate,  accelerations:  Present,  decelerations:  Present Variables and lates UC:   regular, every 2-3  minutes SVE:   Dilation: 4.5 Effacement (%): 70 Station: -3 Exam by:: Swiyyah CNM  Labs: Lab Results  Component Value Date   WBC 7.1 11/19/2017   HGB 10.5 (L) 11/19/2017   HCT 32.2 (L) 11/19/2017   MCV 87.0 11/19/2017   PLT 232 11/19/2017    Assessment / Plan: G2P0010 @ 39w1 admitted for iInduction of labor due to cHTN  On pitocin- Category II tracing, variable and late deceleratios GBS negative  Labor: Will 1/2 pitocin, reposition and give IVF fluid bolus.   AROM once tracing improved Preeclampsia:  n/a Fetal Wellbeing:  Category II Pain Control:  plans epidural I/D:  n/a Anticipated MOD:  NSVD  Swiyyah O Harrington 11/20/2017, 12:16 PM   CNM attestation:  I have seen and examined this patient; I agree with above documentation in the midwife student's note.   Genevive BiRashanea Hanratty is a 22 y.o. G2P0010 for IOL for CHTN   PE: BP 130/89 (BP Location: Right Arm)   Pulse 87   Temp 97.6 F (36.4 C) (Oral)   Resp 16   Ht 5\' 6"  (1.676 m)   Wt 272 lb 6.4 oz (123.6 kg)   LMP 02/19/2017   SpO2 99%   BMI 43.97 kg/m  Gen: calm comfortable, NAD Resp: normal effort, no distress Abd: gravid  ROS, labs, PMH reviewed Category II FHR tracing FHT:  FHR: 150 bpm, variability: moderate,  accelerations:  Present,  decelerations:  Present Variables and lates UC:   regular, every 2-3  Plan: Decrease Pitocin Consider AROM when FHR tracing improves  Sharen CounterLisa  Leftwich-Kirby, CNM 4:31 PM

## 2017-11-20 NOTE — Anesthesia Procedure Notes (Signed)
Epidural Patient location during procedure: OB Start time: 11/20/2017 9:16 PM End time: 11/20/2017 9:24 PM  Staffing Anesthesiologist: Cecile Hearingurk, Shatona Andujar Edward, MD Performed: anesthesiologist   Preanesthetic Checklist Completed: patient identified, pre-op evaluation, timeout performed, IV checked, risks and benefits discussed and monitors and equipment checked  Epidural Patient position: sitting Prep: DuraPrep Patient monitoring: blood pressure and continuous pulse ox Approach: midline Location: L3-L4 Injection technique: LOR air  Needle:  Needle type: Tuohy  Needle gauge: 17 G Needle length: 9 cm Needle insertion depth: 9 cm Catheter size: 19 Gauge Catheter at skin depth: 15 cm Test dose: negative and Other (1% Lidocaine)  Additional Notes Patient identified.  Risk benefits discussed including failed block, incomplete pain control, headache, nerve damage, paralysis, blood pressure changes, nausea, vomiting, reactions to medication both toxic or allergic, and postpartum back pain.  Patient expressed understanding and wished to proceed.  All questions were answered.  Sterile technique used throughout procedure and epidural site dressed with sterile barrier dressing. No paresthesia or other complications noted. The patient did not experience any signs of intravascular injection such as tinnitus or metallic taste in mouth nor signs of intrathecal spread such as rapid motor block. Please see nursing notes for vital signs. Reason for block:procedure for pain

## 2017-11-20 NOTE — Progress Notes (Signed)
Labor Progress Note Christine Mills is a 22 y.o. G2P0010 at 4734w1d presented for IOL for cHTN S:No complaints   O:  BP (!) 142/61   Pulse 87   Temp 98.5 F (36.9 C) (Oral)   Resp 18   Ht 5\' 6"  (1.676 m)   Wt 123.6 kg (272 lb 6.4 oz)   LMP 02/19/2017   SpO2 99%   BMI 43.97 kg/m  EFM: 130 bpm/mod var/no decels  CVE: Dilation: 1 Effacement (%): Thick Cervical Position: Posterior Station: -3 Presentation: Vertex Exam by:: Dr. Rachelle HoraMoss, MD   A&P: 22 y.o. G2P0010 3034w1d here for IOL for cHTN #Labor: not yet in labor. Continue cytotec q4hr prn and FB in place #cHTN: systolic remains in 140s. Continue po labetalol BID  Christine Byus, DO 1:14 AM

## 2017-11-20 NOTE — Progress Notes (Signed)
Christine Mills is a 22 y.o. G2P0010 at 642w1d by LMP admitted for induction of labor due to [place].  Subjective: Recently received epidural. Resting comfortably. No concerns.   Objective: BP 109/72   Pulse 92   Temp 97.9 F (36.6 C) (Oral)   Resp 16   Ht 5\' 6"  (1.676 m)   Wt 123.6 kg (272 lb 6.4 oz)   LMP 02/19/2017   SpO2 99%   BMI 43.97 kg/m  No intake/output data recorded. No intake/output data recorded.  FHT:  FHR: 150 bpm, variability: moderate,  accelerations:  Present,  decelerations:  Present Variables and lates UC:   regular, every 2-4  minutes SVE:   Dilation: 5.5 Effacement (%): 90 Station: -2 Exam by:: Doroteo GlassmanPhelps, DO  Labs: Lab Results  Component Value Date   WBC 8.3 11/20/2017   HGB 10.8 (L) 11/20/2017   HCT 33.0 (L) 11/20/2017   MCV 85.9 11/20/2017   PLT 203 11/20/2017    Assessment / Plan: G2P0010 @ 39w1 admitted for iInduction of labor due to cHTN On pitocin- Category II tracing, variable and late deceleratios GBS negative  Labor: Continue increasing pitocin as appropriate. S/p SROM at 1548. IUPC in place with adequate contractions. Has a protracted latent phase of labor.  CHTN: BPs adequate.  Fetal Wellbeing:  Category II Pain Control:  Epidural Anticipated MOD:  NSVD  Caryl AdaJazma Perkins Molina, DO 11/20/2017, 10:52 PM

## 2017-11-21 ENCOUNTER — Encounter (HOSPITAL_COMMUNITY): Payer: Self-pay | Admitting: *Deleted

## 2017-11-21 DIAGNOSIS — O1092 Unspecified pre-existing hypertension complicating childbirth: Secondary | ICD-10-CM

## 2017-11-21 DIAGNOSIS — Z3A39 39 weeks gestation of pregnancy: Secondary | ICD-10-CM

## 2017-11-21 LAB — CBC
HCT: 32.2 % — ABNORMAL LOW (ref 36.0–46.0)
Hemoglobin: 10.4 g/dL — ABNORMAL LOW (ref 12.0–15.0)
MCH: 28.3 pg (ref 26.0–34.0)
MCHC: 32.3 g/dL (ref 30.0–36.0)
MCV: 87.7 fL (ref 78.0–100.0)
PLATELETS: 200 10*3/uL (ref 150–400)
RBC: 3.67 MIL/uL — AB (ref 3.87–5.11)
RDW: 15.4 % (ref 11.5–15.5)
WBC: 10.5 10*3/uL (ref 4.0–10.5)

## 2017-11-21 MED ORDER — ACETAMINOPHEN 325 MG PO TABS
650.0000 mg | ORAL_TABLET | ORAL | Status: DC | PRN
  Administered 2017-11-21: 650 mg via ORAL
  Filled 2017-11-21: qty 2

## 2017-11-21 MED ORDER — ONDANSETRON HCL 4 MG PO TABS: 4.0000 mg | ORAL_TABLET | ORAL | Status: DC | PRN

## 2017-11-21 MED ORDER — PRENATAL MULTIVITAMIN CH
1.0000 | ORAL_TABLET | Freq: Every day | ORAL | Status: DC
  Filled 2017-11-21 (×2): qty 1

## 2017-11-21 MED ORDER — ZOLPIDEM TARTRATE 5 MG PO TABS: 5.0000 mg | ORAL_TABLET | Freq: Every evening | ORAL | Status: DC | PRN

## 2017-11-21 MED ORDER — DIBUCAINE 1 % RE OINT: 1.0000 "application " | TOPICAL_OINTMENT | RECTAL | Status: DC | PRN

## 2017-11-21 MED ORDER — BENZOCAINE-MENTHOL 20-0.5 % EX AERO: 1.0000 "application " | INHALATION_SPRAY | CUTANEOUS | Status: DC | PRN

## 2017-11-21 MED ORDER — SIMETHICONE 80 MG PO CHEW: 80.0000 mg | CHEWABLE_TABLET | ORAL | Status: DC | PRN

## 2017-11-21 MED ORDER — SENNOSIDES-DOCUSATE SODIUM 8.6-50 MG PO TABS
2.0000 | ORAL_TABLET | ORAL | Status: DC
  Administered 2017-11-21: 2 via ORAL
  Filled 2017-11-21: qty 2

## 2017-11-21 MED ORDER — WITCH HAZEL-GLYCERIN EX PADS: 1.0000 "application " | MEDICATED_PAD | CUTANEOUS | Status: DC | PRN

## 2017-11-21 MED ORDER — IBUPROFEN 600 MG PO TABS
600.0000 mg | ORAL_TABLET | Freq: Four times a day (QID) | ORAL | Status: DC
  Administered 2017-11-21 – 2017-11-22 (×4): 600 mg via ORAL
  Filled 2017-11-21 (×5): qty 1

## 2017-11-21 MED ORDER — ONDANSETRON HCL 4 MG/2ML IJ SOLN: 4.0000 mg | INTRAMUSCULAR | Status: DC | PRN

## 2017-11-21 MED ORDER — COCONUT OIL OIL: 1.0000 "application " | TOPICAL_OIL | Status: DC | PRN

## 2017-11-21 MED ORDER — DIPHENHYDRAMINE HCL 25 MG PO CAPS: 25.0000 mg | ORAL_CAPSULE | Freq: Four times a day (QID) | ORAL | Status: DC | PRN

## 2017-11-21 MED ORDER — TETANUS-DIPHTH-ACELL PERTUSSIS 5-2.5-18.5 LF-MCG/0.5 IM SUSP: 0.5000 mL | Freq: Once | INTRAMUSCULAR | Status: DC

## 2017-11-21 NOTE — Lactation Note (Signed)
This note was copied from a baby's chart. Lactation Consultation Note  Patient Name: Girl Genevive BiRashanea Muska ZOXWR'UToday's Date: 11/21/2017 Reason for consult: Initial assessment;Term Breastfeeding consultation services and support information given and reviewed.  This is mom's first baby and newborn is 248 hours old.  Baby has had attempts at breast only.  Baby is currently awake and cueing.  Baby positioned skin to skin in cradle hold.  Baby latched well after a few attempts. Reviewed waking techniques and breast massage. She fed for 7 minutes and then pulled off content.  Instructed to feed with any feeding cue and call for assist prn.  Maternal Data Has patient been taught Hand Expression?: Yes Does the patient have breastfeeding experience prior to this delivery?: No  Feeding Feeding Type: Breast Fed Length of feed: 7 min  LATCH Score Latch: Grasps breast easily, tongue down, lips flanged, rhythmical sucking.  Audible Swallowing: A few with stimulation  Type of Nipple: Everted at rest and after stimulation  Comfort (Breast/Nipple): Soft / non-tender  Hold (Positioning): Assistance needed to correctly position infant at breast and maintain latch.  LATCH Score: 8  Interventions Interventions: Breast feeding basics reviewed;Assisted with latch;Breast compression;Skin to skin;Adjust position;Breast massage;Support pillows  Lactation Tools Discussed/Used     Consult Status Consult Status: Follow-up Date: 11/22/17 Follow-up type: In-patient    Huston FoleyMOULDEN, Piera Downs S 11/21/2017, 2:20 PM

## 2017-11-21 NOTE — Anesthesia Postprocedure Evaluation (Signed)
Anesthesia Post Note  Patient: Christine Mills  Procedure(s) Performed: AN AD HOC LABOR EPIDURAL     Patient location during evaluation: Mother Baby Anesthesia Type: Epidural Level of consciousness: awake and alert and oriented Pain management: satisfactory to patient Vital Signs Assessment: post-procedure vital signs reviewed and stable Respiratory status: spontaneous breathing and nonlabored ventilation Cardiovascular status: stable Postop Assessment: no headache, no backache, no signs of nausea or vomiting, adequate PO intake and patient able to bend at knees (patient up walking) Anesthetic complications: no    Last Vitals:  Vitals:   11/21/17 0802 11/21/17 0905  BP: 127/65 136/68  Pulse: 86 89  Resp: 20   Temp: 37.2 C 37.2 C  SpO2: 100% 100%    Last Pain:  Vitals:   11/21/17 0905  TempSrc: Oral  PainSc:    Pain Goal:                 Madison HickmanGREGORY,Elham Fini

## 2017-11-21 NOTE — Progress Notes (Signed)
Genevive BiRashanea Spilde is a 22 y.o. G2P0010 at 7157w1d by LMP admitted for induction of labor due to [place].  Subjective: Recently received epidural. Resting comfortably. No concerns.   Objective: BP (!) 130/56   Pulse 93   Temp 98.2 F (36.8 C) (Oral)   Resp 16   Ht 5\' 6"  (1.676 m)   Wt 123.6 kg (272 lb 6.4 oz)   LMP 02/19/2017   SpO2 96%   BMI 43.97 kg/m  No intake/output data recorded. No intake/output data recorded.  FHT:  FHR: 150 bpm, variability: moderate,  accelerations:  Present,  decelerations:  Absent UC: occasional  minutes SVE:   Dilation: 10 Effacement (%): 100 Station: Plus 1 Exam by:: Doroteo GlassmanPhelps, DO   SROM: 1548 clear   Labs: Lab Results  Component Value Date   WBC 8.3 11/20/2017   HGB 10.8 (L) 11/20/2017   HCT 33.0 (L) 11/20/2017   MCV 85.9 11/20/2017   PLT 203 11/20/2017    Assessment / Plan: G2P0010 @ 39w1 admitted for iInduction of labor due to cHTN On pitocin- Category I tracing Active labor GBS negative  Labor: Pitocin cut off earlier in course due to late decls by nurse. Will restart again as patient now with no contractions. Continue increasing pitocin as appropriate. Will try and practice push once contractions are adequate again or patient feeling pressure.  CHTN: BPs adequate.  Fetal Wellbeing:  Category I Pain Control:  Epidural Anticipated MOD:  NSVD  Caryl AdaJazma Phelps, DO 11/21/2017, 2:46 AM

## 2017-11-22 MED ORDER — AMLODIPINE BESYLATE 5 MG PO TABS: 5.0000 mg | ORAL_TABLET | Freq: Every day | ORAL | 0 refills | Status: DC

## 2017-11-22 MED ORDER — AMLODIPINE BESYLATE 5 MG PO TABS
5.0000 mg | ORAL_TABLET | Freq: Every day | ORAL | Status: DC
  Filled 2017-11-22: qty 1

## 2017-11-22 NOTE — Discharge Instructions (Signed)
Vaginal Delivery, Care After °Refer to this sheet in the next few weeks. These instructions provide you with information about caring for yourself after vaginal delivery. Your health care provider may also give you more specific instructions. Your treatment has been planned according to current medical practices, but problems sometimes occur. Call your health care provider if you have any problems or questions. °What can I expect after the procedure? °After vaginal delivery, it is common to have: °· Some bleeding from your vagina. °· Soreness in your abdomen, your vagina, and the area of skin between your vaginal opening and your anus (perineum). °· Pelvic cramps. °· Fatigue. ° °Follow these instructions at home: °Medicines °· Take over-the-counter and prescription medicines only as told by your health care provider. °· If you were prescribed an antibiotic medicine, take it as told by your health care provider. Do not stop taking the antibiotic until it is finished. °Driving ° °· Do not drive or operate heavy machinery while taking prescription pain medicine. °· Do not drive for 24 hours if you received a sedative. °Lifestyle °· Do not drink alcohol. This is especially important if you are breastfeeding or taking medicine to relieve pain. °· Do not use tobacco products, including cigarettes, chewing tobacco, or e-cigarettes. If you need help quitting, ask your health care provider. °Eating and drinking °· Drink at least 8 eight-ounce glasses of water every day unless you are told not to by your health care provider. If you choose to breastfeed your baby, you may need to drink more water than this. °· Eat high-fiber foods every day. These foods may help prevent or relieve constipation. High-fiber foods include: °? Whole grain cereals and breads. °? Brown rice. °? Beans. °? Fresh fruits and vegetables. °Activity °· Return to your normal activities as told by your health care provider. Ask your health care provider  what activities are safe for you. °· Rest as much as possible. Try to rest or take a nap when your baby is sleeping. °· Do not lift anything that is heavier than your baby or 10 lb (4.5 kg) until your health care provider says that it is safe. °· Talk with your health care provider about when you can engage in sexual activity. This may depend on your: °? Risk of infection. °? Rate of healing. °? Comfort and desire to engage in sexual activity. °Vaginal Care °· If you have an episiotomy or a vaginal tear, check the area every day for signs of infection. Check for: °? More redness, swelling, or pain. °? More fluid or blood. °? Warmth. °? Pus or a bad smell. °· Do not use tampons or douches until your health care provider says this is safe. °· Watch for any blood clots that may pass from your vagina. These may look like clumps of dark red, brown, or black discharge. °General instructions °· Keep your perineum clean and dry as told by your health care provider. °· Wear loose, comfortable clothing. °· Wipe from front to back when you use the toilet. °· Ask your health care provider if you can shower or take a bath. If you had an episiotomy or a perineal tear during labor and delivery, your health care provider may tell you not to take baths for a certain length of time. °· Wear a bra that supports your breasts and fits you well. °· If possible, have someone help you with household activities and help care for your baby for at least a few days after   you leave the hospital. °· Keep all follow-up visits for you and your baby as told by your health care provider. This is important. °Contact a health care provider if: °· You have: °? Vaginal discharge that has a bad smell. °? Difficulty urinating. °? Pain when urinating. °? A sudden increase or decrease in the frequency of your bowel movements. °? More redness, swelling, or pain around your episiotomy or vaginal tear. °? More fluid or blood coming from your episiotomy or  vaginal tear. °? Pus or a bad smell coming from your episiotomy or vaginal tear. °? A fever. °? A rash. °? Little or no interest in activities you used to enjoy. °? Questions about caring for yourself or your baby. °· Your episiotomy or vaginal tear feels warm to the touch. °· Your episiotomy or vaginal tear is separating or does not appear to be healing. °· Your breasts are painful, hard, or turn red. °· You feel unusually sad or worried. °· You feel nauseous or you vomit. °· You pass large blood clots from your vagina. If you pass a blood clot from your vagina, save it to show to your health care provider. Do not flush blood clots down the toilet without having your health care provider look at them. °· You urinate more than usual. °· You are dizzy or light-headed. °· You have not breastfed at all and you have not had a menstrual period for 12 weeks after delivery. °· You have stopped breastfeeding and you have not had a menstrual period for 12 weeks after you stopped breastfeeding. °Get help right away if: °· You have: °? Pain that does not go away or does not get better with medicine. °? Chest pain. °? Difficulty breathing. °? Blurred vision or spots in your vision. °? Thoughts about hurting yourself or your baby. °· You develop pain in your abdomen or in one of your legs. °· You develop a severe headache. °· You faint. °· You bleed from your vagina so much that you fill two sanitary pads in one hour. °This information is not intended to replace advice given to you by your health care provider. Make sure you discuss any questions you have with your health care provider. °Document Released: 09/04/2000 Document Revised: 02/19/2016 Document Reviewed: 09/22/2015 °Elsevier Interactive Patient Education © 2018 [employer] ° °

## 2017-11-22 NOTE — Discharge Summary (Addendum)
OB Discharge Summary    Patient Name: Christine Mills DOB: 06/08/1996 MRN: [id number] Date of admission: 11/19/2017  Delivering MD: Pincus LargePHELPS, JAZMA Y )  Date of discharge: 11/22/2017  Admitting diagnosis: [facility] Intrauterine pregnancy: 7638w2d    Secondary diagnosis:  Active Problems:   Patient Active Problem List   Diagnosis Date Noted  . SVD (spontaneous vaginal delivery) 11/21/2017  . Obesity in pregnancy 11/16/2017  . Rubella non-immune status, antepartum 05/12/2017  . Low vitamin D level 05/12/2017  . 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 Supervision of high risk pregnancy, antepartum 05/06/2017  . Chronic hypertension during pregnancy, antepartum 05/06/2017   Additional problems:  CHTN on labetalol 150mg  BID     Discharge diagnosis: Term Pregnancy Delivered                                                                                                Post partum procedures:None   Complications: None  Hospital course:  Induction of Labor With Vaginal Delivery   22 y.o. yo G2P1011 at 2638w2d was admitted to the hospital 11/19/2017 for induction of labor.  Indication for induction: CHTN.  Patient had an uncomplicated labor course as follows: Membrane Rupture Time/Date: 3:48 PM ,11/20/2017   Intrapartum Procedures: Episiotomy: None [1]                                         Lacerations:    None Patient had delivery of a Viable infant.  Information for the patient's newborn:  Jamal CollinCarroll, Girl Ellianne [[id number]][[id number]]  Delivery Method: Vaginal, Spontaneous(Filed from Delivery Summary)   11/21/2017  Details of delivery can be found in separate delivery note.  Patient's postpartum course notable for elevated BP to SBP 140s. Started on Norvasc 5mg . Patient is discharged home 11/22/17.  Physical exam  Vitals:   11/21/17 1712 11/22/17 0531  BP: (!) 146/77 130/71  Pulse: (!) 102 88  Resp: 20 20  Temp: 98.6 F (37 C) 98.3 F (36.8 C)  SpO2:       General: alert, cooperative and no distress Lochia: appropriate Uterine Fundus: firm Incision: N/A DVT Evaluation: No evidence of DVT seen on physical exam.  Labs: No results found for this or any previous visit (from the past 24 hour(s)).   Discharge instruction: per After Visit Summary and "Baby and Me Booklet".  After visit meds:  No Known Allergies  Allergies as of 11/22/2017   No Known Allergies     Medication List    STOP taking these medications   aspirin EC 81 MG tablet   CITRANATAL BLOOM 90-1 MG Tabs   labetalol 100 MG tablet Commonly known as:  NORMODYNE   Vitamin D (Ergocalciferol) 50000 units Caps capsule Commonly known as:  DRISDOL     TAKE these medications   amLODipine 5 MG tablet Commonly known as:  NORVASC Take 1 tablet (5 mg total) by mouth daily.   COMFORT FIT MATERNITY SUPP LG Misc 1 Units daily by  Does not apply route.   prenatal multivitamin Tabs tablet Take 1 tablet by mouth daily at 12 noon.       Diet: routine diet  Activity: Advance as tolerated. Pelvic rest for 6 weeks.   Outpatient follow up:1 week for BP check, 4 weeks for postpartum follow up  Postpartum contraception: Undecided  Newborn Data: APGAR (1 MIN): 9   APGAR (5 MINS): 9    Baby Feeding: Breast and Bottle Disposition:home with mother  Ellwood Dense, DO  11/22/2017   OB FELLOW DISCHARGE ATTESTATION I have seen and examined this patient and agree with above documentation in the resident's note.   Pt D/c'd home on Norvasc. Will have 1-week BP check  Frederik Pear, MD OB Fellow 9:09 AM

## 2017-11-22 NOTE — Lactation Note (Signed)
This note was copied from a baby's chart. Lactation Consultation Note  Patient Name: Christine Genevive BiRashanea Sebald WNUUV'OToday's Date: 11/22/2017 Reason for consult: Follow-up assessment   P1, Baby 27 hours old.  Mother states breastfeeding is going well. Answered questions regarding pumping and going back to work. Discussed milk storage. Mom encouraged to feed baby 8-12 times/24 hours and with feeding cues.  Reviewed engorgement care and monitoring voids/stools.     Maternal Data    Feeding Feeding Type: Breast Fed Length of feed: 10 min  LATCH Score Latch: Grasps breast easily, tongue down, lips flanged, rhythmical sucking.  Audible Swallowing: Spontaneous and intermittent  Type of Nipple: Everted at rest and after stimulation  Comfort (Breast/Nipple): Filling, red/small blisters or bruises, mild/mod discomfort  Hold (Positioning): Assistance needed to correctly position infant at breast and maintain latch.  LATCH Score: 8  Interventions Interventions: Breast feeding basics reviewed;Assisted with latch;Support pillows;Position options;Adjust position  Lactation Tools Discussed/Used     Consult Status Consult Status: Complete    Hardie PulleyBerkelhammer, Ruth Boschen 11/22/2017, 9:22 AM

## 2017-11-26 ENCOUNTER — Other Ambulatory Visit (HOSPITAL_COMMUNITY): Payer: Managed Care, Other (non HMO)

## 2017-12-20 ENCOUNTER — Encounter: Payer: Self-pay | Admitting: *Deleted

## 2017-12-20 ENCOUNTER — Encounter: Payer: Self-pay | Admitting: Obstetrics and Gynecology

## 2017-12-20 ENCOUNTER — Ambulatory Visit (INDEPENDENT_AMBULATORY_CARE_PROVIDER_SITE_OTHER): Payer: Managed Care, Other (non HMO) | Admitting: Obstetrics and Gynecology

## 2017-12-20 DIAGNOSIS — O0990 Supervision of high risk pregnancy, unspecified, unspecified trimester: Secondary | ICD-10-CM

## 2017-12-20 MED ORDER — NORETHINDRONE 0.35 MG PO TABS: 1.0000 | ORAL_TABLET | Freq: Every day | ORAL | 11 refills | Status: DC

## 2017-12-20 MED ORDER — AMLODIPINE BESYLATE 5 MG PO TABS: 5.0000 mg | ORAL_TABLET | Freq: Every day | ORAL | 3 refills | Status: DC

## 2017-12-20 NOTE — Progress Notes (Signed)
Subjective:     Christine Mills is a 22 y.o. female who presents for a postpartum visit. She is 4 weeks postpartum following a spontaneous vaginal delivery. I have fully reviewed the prenatal and intrapartum course. The delivery was at 39 gestational weeks. Outcome: spontaneous vaginal delivery. Anesthesia: epidural. Postpartum course has been doing well. Baby's course has been doing well. Baby is feeding by both breast and bottle - gerber gentle. Bleeding staining only. Bowel function is normal. Bladder function is normal. Patient is not sexually active. Contraception method is abstinence. Postpartum depression screening: negative, Score 0.     Review of Systems Pertinent items are noted in HPI.   Objective:    BP (!) 157/78   Pulse 93   Wt 263 lb (119.3 kg)   LMP 02/19/2017   BMI 42.45 kg/m   General:  alert, cooperative and no distress   Breasts:  inspection negative, no nipple discharge or bleeding, no masses or nodularity palpable  Lungs: clear to auscultation bilaterally  Heart:  regular rate and rhythm  Abdomen: soft, non-tender; bowel sounds normal; no masses,  no organomegaly   Vulva:  normal  Vagina: normal vagina, no discharge, exudate, lesion, or erythema  Cervix:  multiparous appearance  Corpus: normal size, contour, position, consistency, mobility, non-tender  Adnexa:  normal adnexa and no mass, fullness, tenderness  Rectal Exam: Not performed.        Assessment:     Normal postpartum exam. Pap smear not done at today's visit. Patient with LGSIL 04/2017- plan for repeat pap smear in 04/2018.   Plan:    1. Contraception: oral progesterone-only contraceptive 2. Patient is medically cleared to resume all activities of daily living.  Patient with elevated BP. She has not been taking Norvasc. Rx provided. Advised patient to follow up with PCP 3. Follow up in: 4 months for annual with repeat pap smear or as needed.

## 2019-08-18 ENCOUNTER — Other Ambulatory Visit: Payer: Self-pay

## 2019-08-18 ENCOUNTER — Encounter (HOSPITAL_COMMUNITY): Payer: Self-pay

## 2019-08-18 ENCOUNTER — Emergency Department (HOSPITAL_COMMUNITY): Payer: Medicaid Other

## 2019-08-18 ENCOUNTER — Emergency Department (HOSPITAL_COMMUNITY)
Admission: EM | Admit: 2019-08-18 | Discharge: 2019-08-18 | Disposition: A | Discharge status: Home / Self Care | Payer: Medicaid Other | Attending: Emergency Medicine | Admitting: Emergency Medicine

## 2019-08-18 DIAGNOSIS — M5489 Other dorsalgia: Secondary | ICD-10-CM | POA: Diagnosis not present

## 2019-08-18 DIAGNOSIS — S161XXA Strain of muscle, fascia and tendon at neck level, initial encounter: Secondary | ICD-10-CM | POA: Diagnosis not present

## 2019-08-18 DIAGNOSIS — M542 Cervicalgia: Secondary | ICD-10-CM | POA: Diagnosis not present

## 2019-08-18 DIAGNOSIS — I10 Essential (primary) hypertension: Secondary | ICD-10-CM | POA: Insufficient documentation

## 2019-08-18 DIAGNOSIS — Y929 Unspecified place or not applicable: Secondary | ICD-10-CM | POA: Diagnosis not present

## 2019-08-18 DIAGNOSIS — S199XXA Unspecified injury of neck, initial encounter: Secondary | ICD-10-CM | POA: Diagnosis present

## 2019-08-18 DIAGNOSIS — R457 State of emotional shock and stress, unspecified: Secondary | ICD-10-CM | POA: Diagnosis not present

## 2019-08-18 DIAGNOSIS — R000 Tachycardia, unspecified: Secondary | ICD-10-CM | POA: Diagnosis not present

## 2019-08-18 DIAGNOSIS — S239XXA Sprain of unspecified parts of thorax, initial encounter: Secondary | ICD-10-CM | POA: Diagnosis not present

## 2019-08-18 DIAGNOSIS — R52 Pain, unspecified: Secondary | ICD-10-CM | POA: Diagnosis not present

## 2019-08-18 DIAGNOSIS — Y939 Activity, unspecified: Secondary | ICD-10-CM | POA: Diagnosis not present

## 2019-08-18 DIAGNOSIS — Y999 Unspecified external cause status: Secondary | ICD-10-CM | POA: Diagnosis not present

## 2019-08-18 DIAGNOSIS — R0689 Other abnormalities of breathing: Secondary | ICD-10-CM | POA: Diagnosis not present

## 2019-08-18 DIAGNOSIS — S00212A Abrasion of left eyelid and periocular area, initial encounter: Secondary | ICD-10-CM | POA: Diagnosis not present

## 2019-08-18 DIAGNOSIS — R0781 Pleurodynia: Secondary | ICD-10-CM | POA: Diagnosis not present

## 2019-08-18 MED ORDER — HYDROCODONE-ACETAMINOPHEN 5-325 MG PO TABS
1.0000 | ORAL_TABLET | Freq: Once | ORAL | Status: AC
  Administered 2019-08-18: 1 via ORAL
  Filled 2019-08-18: qty 1

## 2019-08-18 MED ORDER — NAPROXEN 375 MG PO TABS: ORAL_TABLET | ORAL | 0 refills | Status: AC

## 2019-08-18 MED ORDER — CYCLOBENZAPRINE HCL 10 MG PO TABS: 10.0000 mg | ORAL_TABLET | Freq: Three times a day (TID) | ORAL | 0 refills | Status: AC | PRN
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
## 2019-08-18 NOTE — ED Notes (Signed)
Superficial laceration above left eye.

## 2019-12-07 IMAGING — US US MFM FETAL BPP W/O NON-STRESS
1 series · 14 of 28 positions shown · non-contrast
Comparison: none

[Series 1: us mfm fetal bpp w/o non-stress · 30 acquisitions, 14 frames shown]
[im 2/30]
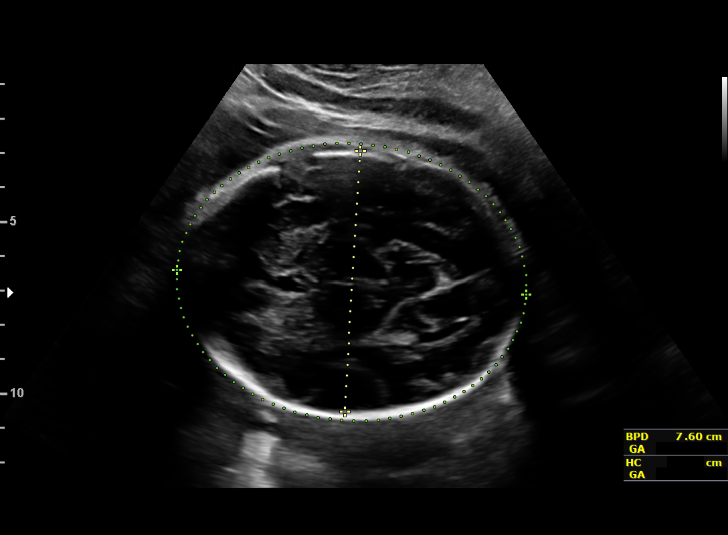
[im 4/30]
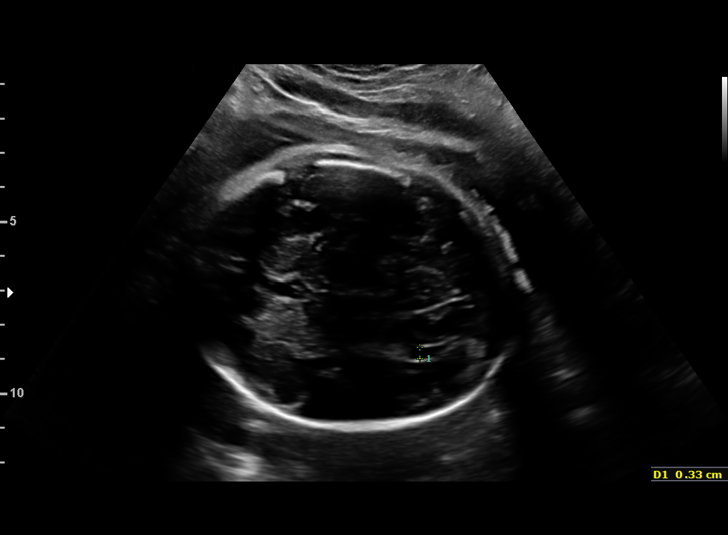
[im 6/30]
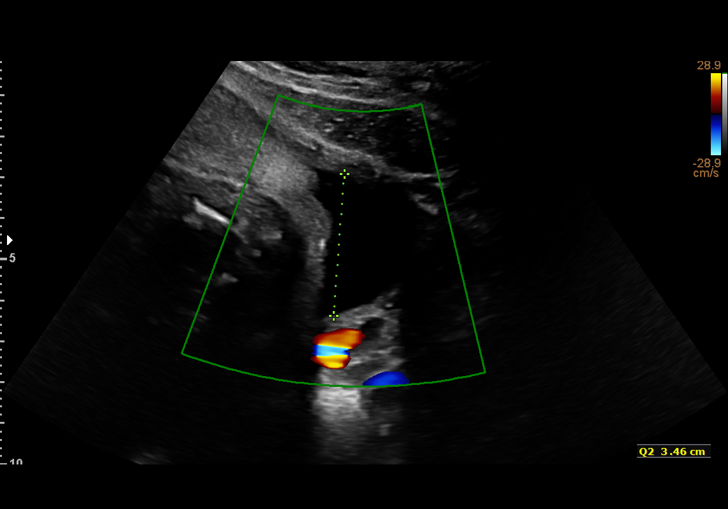
[im 8/30]
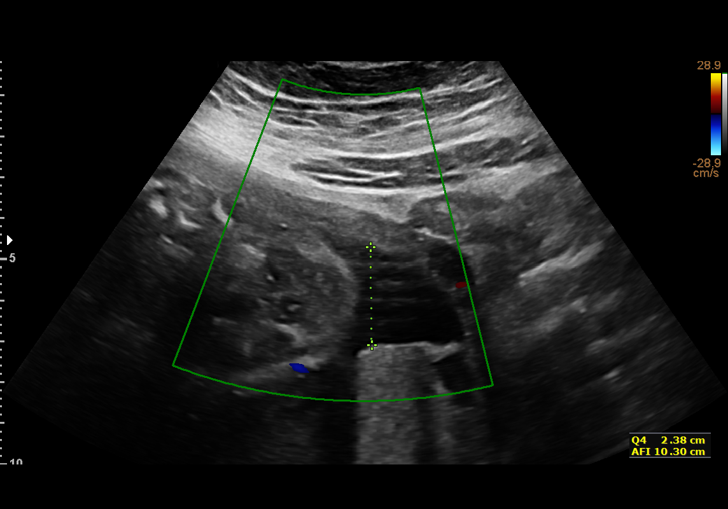
[im 10/30]
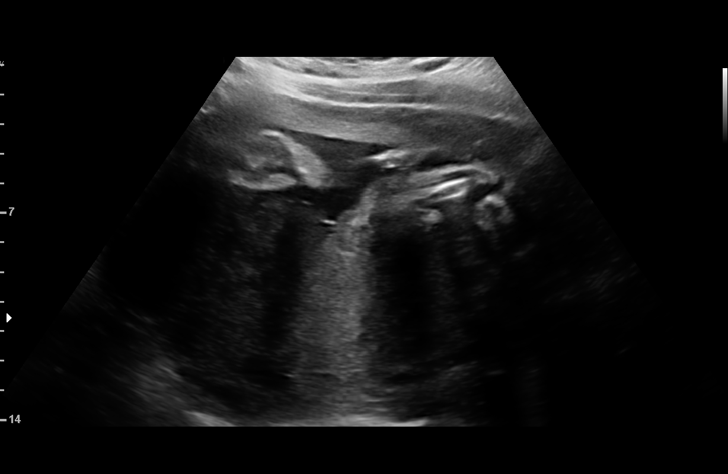
[im 12/30]
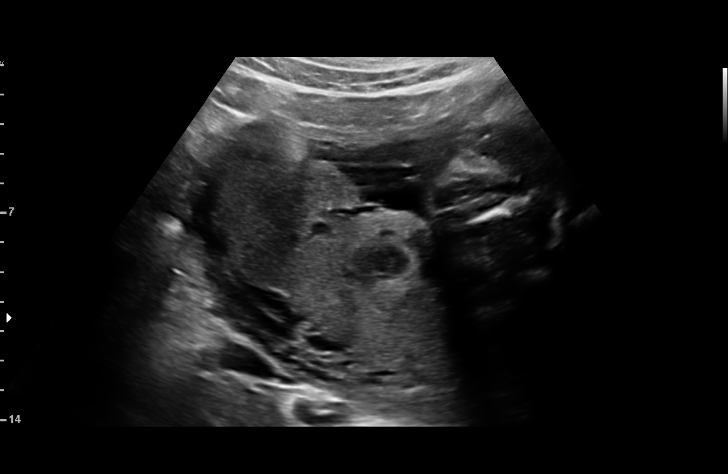
[im 14/30]
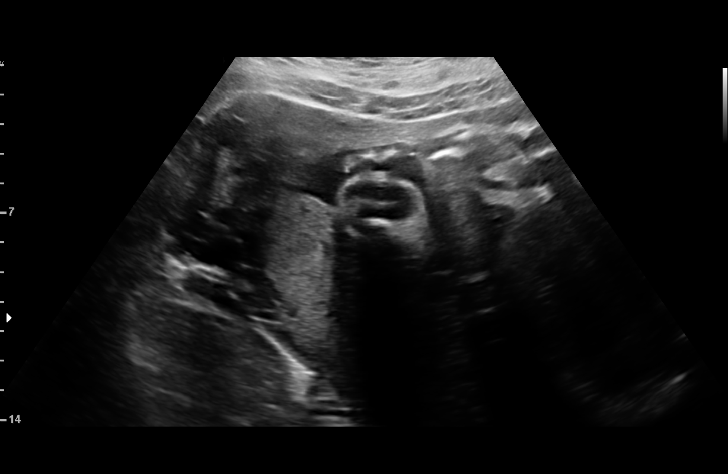
[im 17/30]
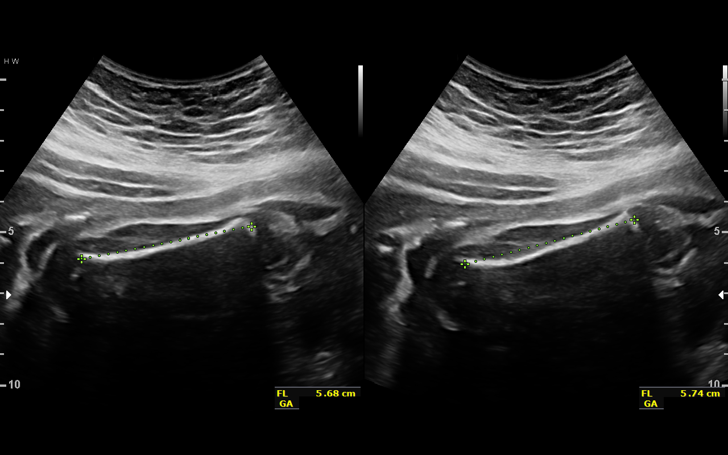
[im 19/30]
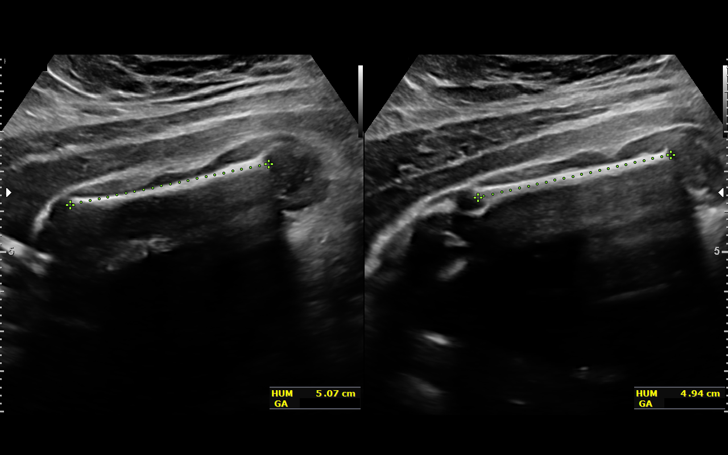
[im 21/30]
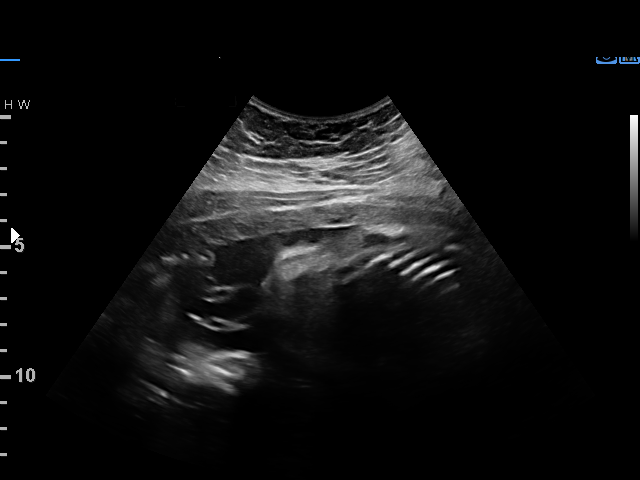
[im 23/30]
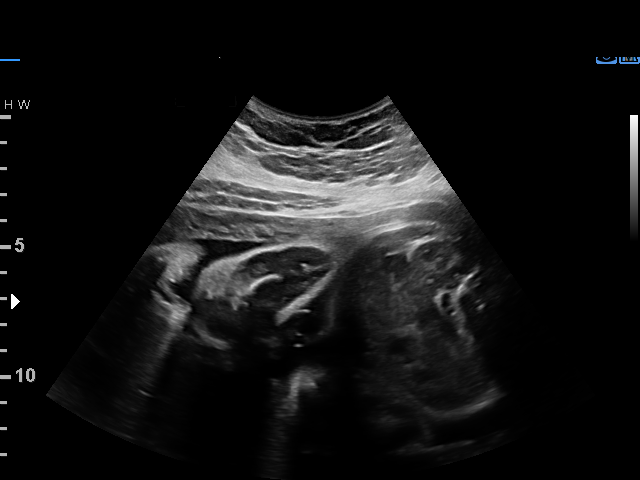
[im 25/30]
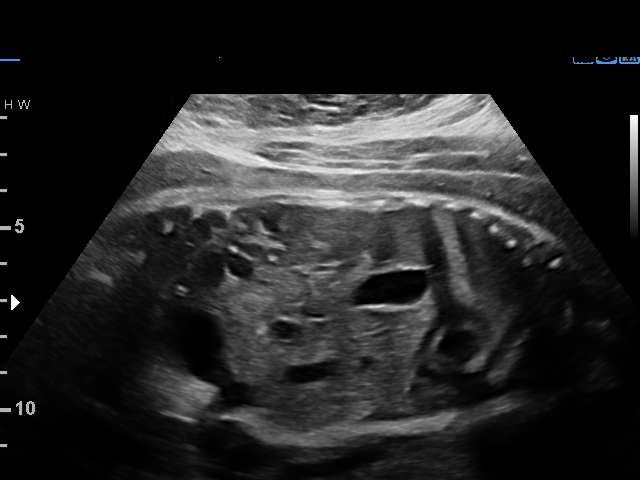
[im 27/30]
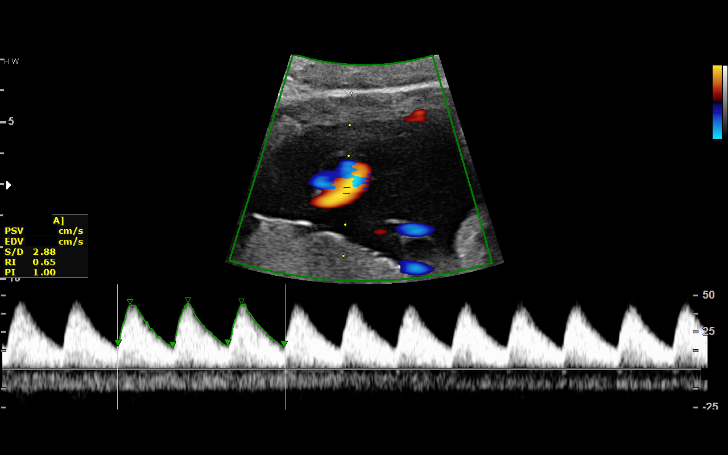
[im 30/30]
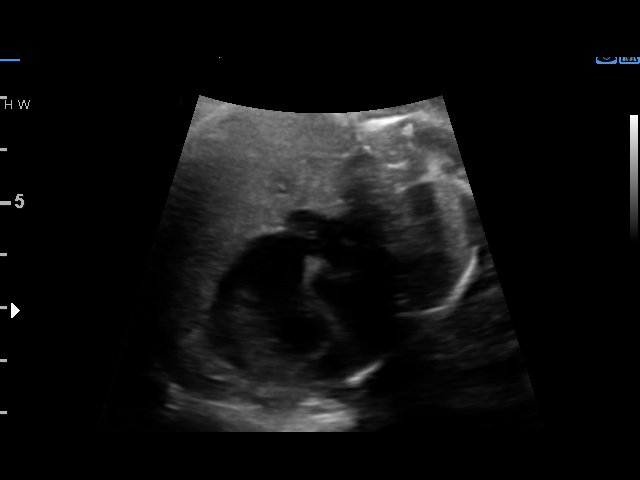

[14 of 28 positions shown; findings below may reference images not displayed]

[REDACTED]care - [HOSPITAL]

1  ANANYA DONG            [id number]      [id number]     [id number]
2  ANANYA DONG            [id number]      [id number]     [id number]
3  ANANYA DONG            [id number]      [phone]     [id number]
Indications

31 weeks gestation of pregnancy
Obesity complicating pregnancy, second
trimester (pregravid BMI 40)
Hypertension - Chronic/Pre-existing
(labetalol and ASA)
Encounter for antenatal screening for
malformations
Maternal care for known or suspected poor
fetal growth, third trimester, not applicable or
unspecified
OB History

Gravidity:    2         Term:   0        Prem:   0        SAB:   1
TOP:          0       Ectopic:  0        Living: 0
Fetal Evaluation

Num Of Fetuses:     1
Fetal Heart         148
Rate(bpm):
Cardiac Activity:   Observed
Presentation:       Cephalic
Placenta:           Posterior, above cervical os
P. Cord Insertion:  Previously Visualized
Amniotic Fluid
AFI FV:      Subjectively within normal limits

AFI Sum(cm)     %Tile       Largest Pocket(cm)
10.3            17

RUQ(cm)       RLQ(cm)       LUQ(cm)        LLQ(cm)
2.41
Biophysical Evaluation

Amniotic F.V:   Within normal limits       F. Tone:        Observed
F. Movement:    Observed                   Score:          [DATE]
F. Breathing:   Observed
Biometry

BPD:      76.1  mm     G. Age:  30w 4d         25  %    CI:        72.84   %    70 - 86
FL/HC:      20.1   %    19.3 -
HC:      283.5  mm     G. Age:  31w 1d         19  %    HC/AC:      1.14        0.96 -
AC:      249.6  mm     G. Age:  29w 1d          7  %    FL/BPD:     75.0   %    71 - 87
FL:       57.1  mm     G. Age:  30w 0d         13  %    FL/AC:      22.9   %    20 - 24
HUM:      50.1  mm     G. Age:  29w 3d         17  %

Est. FW:    5335  gm      3 lb 3 oz     29  %
Gestational Age

LMP:           31w 0d        Date:  02/19/17                 EDD:   11/26/17
U/S Today:     30w 2d                                        EDD:   12/01/17
Best:          31w 0d     Det. By:  LMP  (02/19/17)          EDD:   11/26/17
Anatomy

Cranium:               Appears normal         Aortic Arch:            Previously seen
Cavum:                 Previously seen        Ductal Arch:            Previously seen
Ventricles:            Appears normal         Diaphragm:              Previously seen
Choroid Plexus:        Previously seen        Stomach:                Appears normal, left
sided
Cerebellum:            Previously seen        Abdomen:                Previously seen
Posterior Fossa:       Previously seen        Abdominal Wall:         Previously seen
Nuchal Fold:           Previously seen        Cord Vessels:           Previously seen
Face:                  Orbits and profile     Kidneys:                Appear normal
previously seen
Lips:                  Previously seen        Bladder:                Appears normal
Thoracic:              Appears normal         Spine:                  Ltd views appear
normal prev.
Heart:                 Previously seen        Upper Extremities:      Previously seen
RVOT:                  Previously seen        Lower Extremities:      Previously seen
LVOT:                  Previously seen

Other:  Fetus appears to be a female. Heels appear normal previously.
Hands not well visualized.  Technically difficult due to maternal
habitus and fetal position.
Doppler - Fetal Vessels

Umbilical Artery
S/D     %tile     RI              PI              PSV    ADFV    RDFV
(cm/s)
2.88       56   0.6[REDACTED]       N       N
Cervix Uterus Adnexa

Cervix
Not visualized (advanced GA >07wks)

Uterus
No abnormality visualized.

Left Ovary
No adnexal mass visualized.

Right Ovary
No adnexal mass visualized.

Cul De Sac:   No free fluid seen.

Adnexa:       No abnormality visualized.
Impression

Singleton intrauterine pregnancy at 31 weeks 0 days
gestation with fetal cardiac activity
Cephalic presentation
Posterior placenta
BPP [DATE] with an AFI > 10 cm
Growth at 29th centile and AC 
7th centile
UA Dopplers within normal limits
Recommendations

Serial ultrasounds for growth
Antepartum testing serially

## 2019-12-28 IMAGING — US US MFM OB FOLLOW-UP
1 series · 13 of 28 positions shown · non-contrast
Comparison: none

[Series 1: us mfm ob follow-up · 13 of 54 slices shown]
[im 2/54]
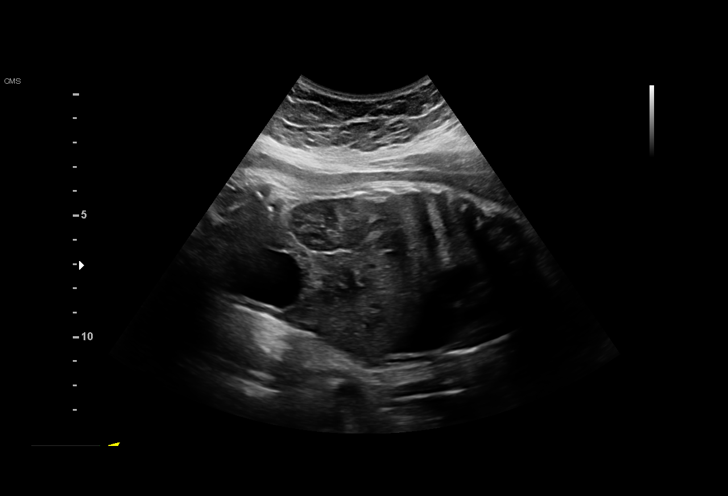
[im 6/54]
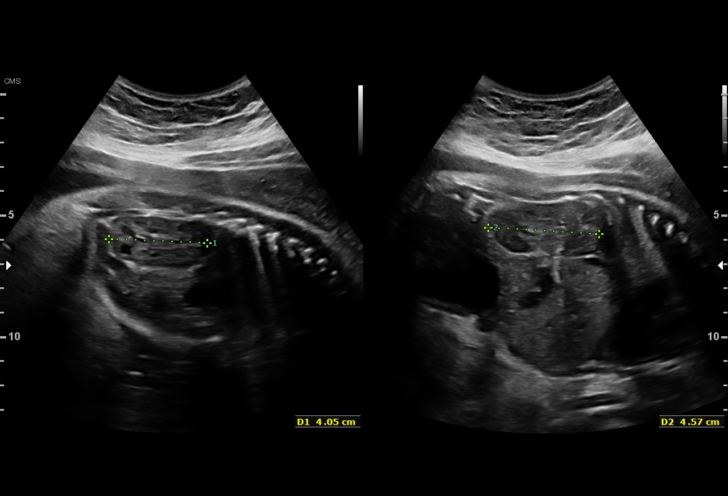
[im 10/54]
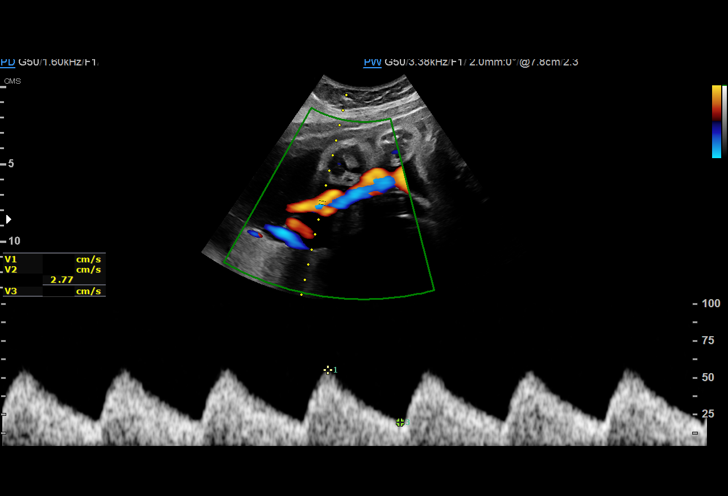
[im 14/54]
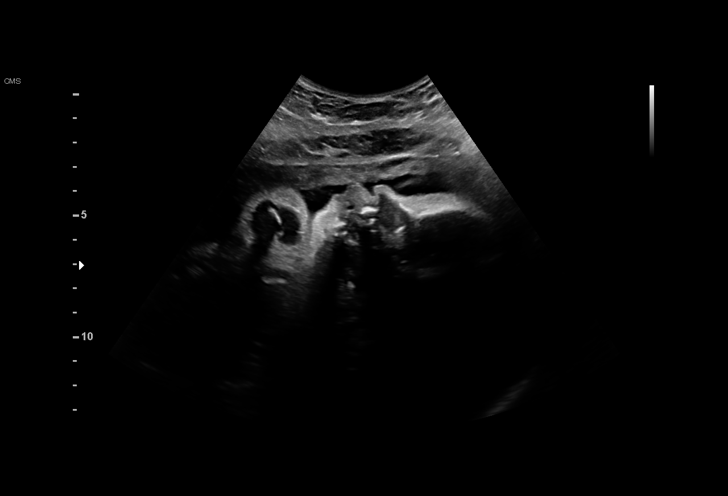
[im 18/54]
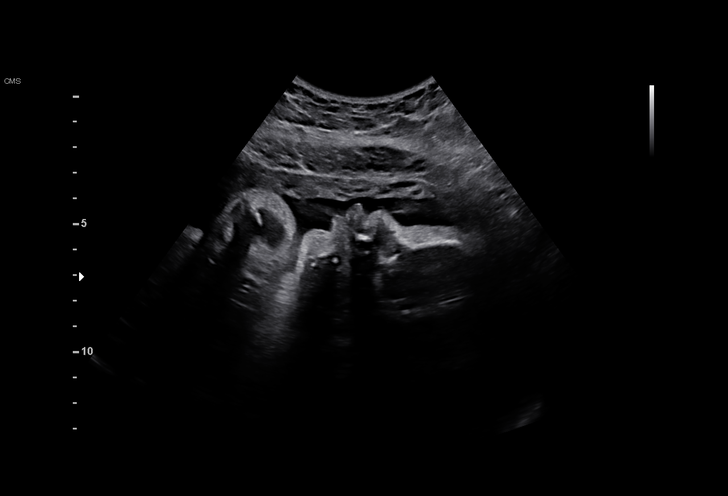
[im 22/54]
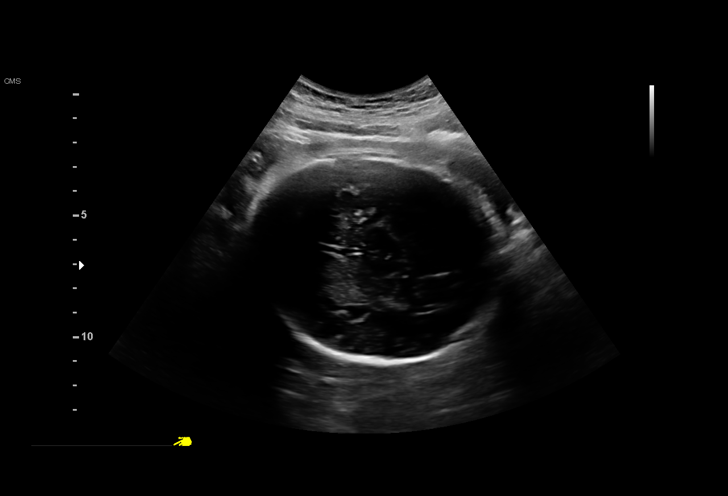
[im 28/54]
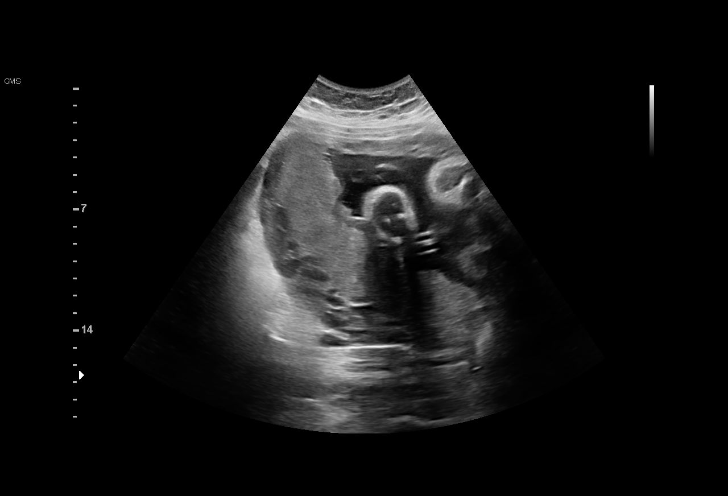
[im 32/54]
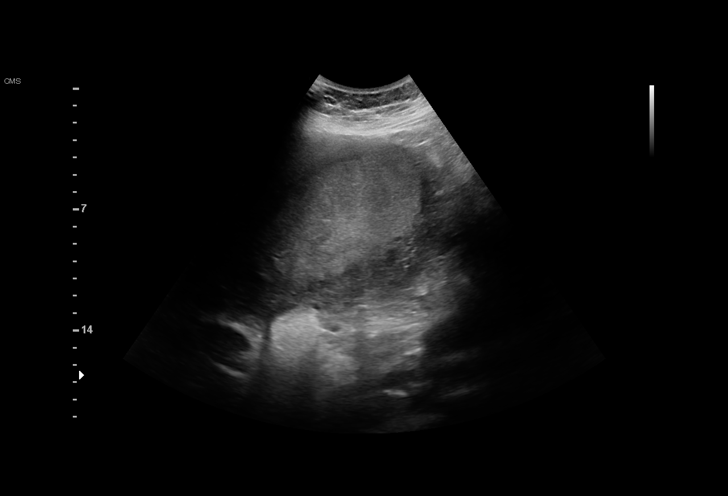
[im 36/54]
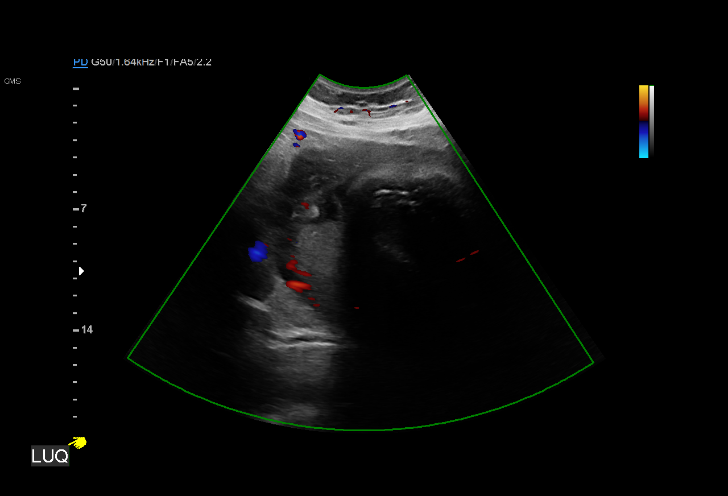
[im 40/54]
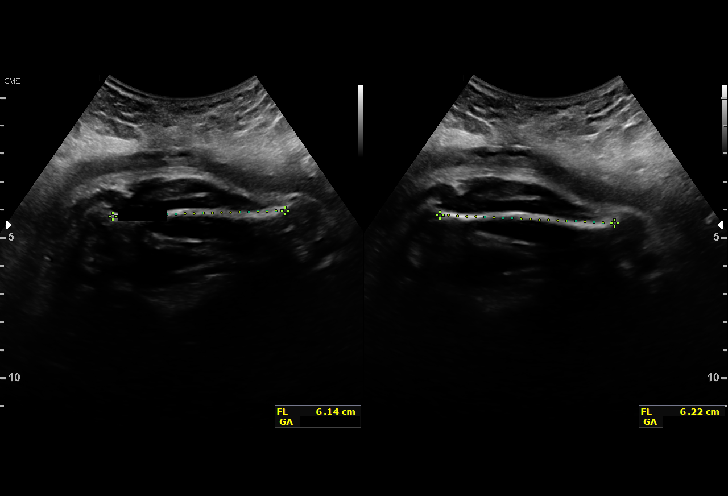
[im 44/54]
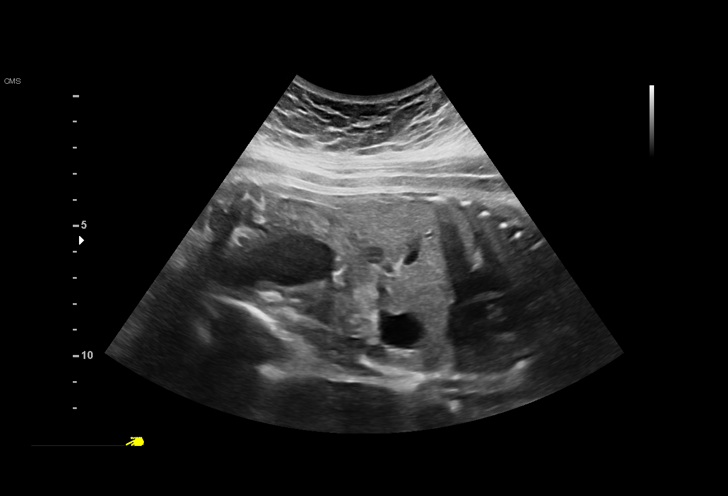
[im 48/54]
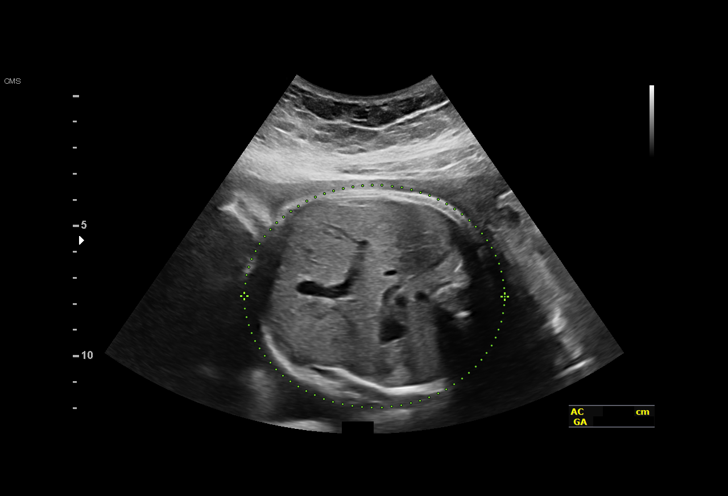
[im 52/54]
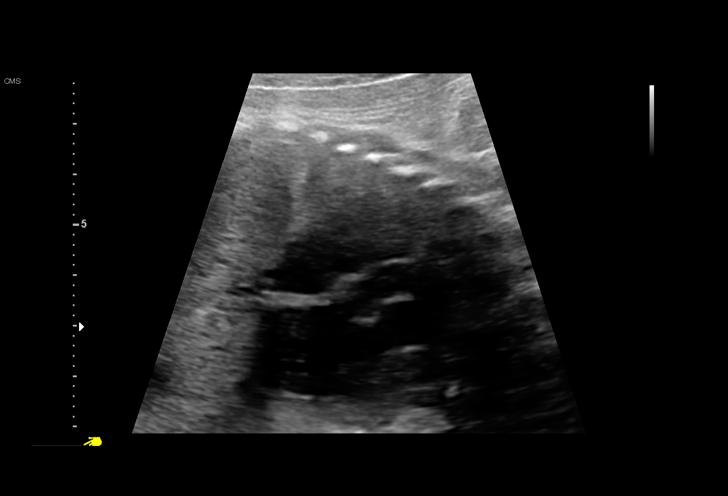

[13 of 28 positions shown; findings below may reference images not displayed]

[REDACTED]care - [HOSPITAL]

1  MARY ROSE GAMBINO              [id number]      [id number]     [id number]
2  MARY ROSE GAMBINO              [id number]      [id number]     [id number]
3  LUDSON RADAELLI            [id number]      [phone]     [id number]
Indications

34 weeks gestation of pregnancy
Obesity complicating pregnancy, third
trimester
Hypertension - Chronic/Pre-existing
(labetalol and ASA)
Maternal care for known or suspected poor
fetal growth, third trimester, not applicable or
unspecified; AC at the 7th %tile
OB History

Gravidity:    2         Term:   0        Prem:   0        SAB:   1
TOP:          0       Ectopic:  0        Living: 0
Fetal Evaluation

Num Of Fetuses:     1
Fetal Heart         159
Rate(bpm):
Cardiac Activity:   Observed
Presentation:       Cephalic
Placenta:           Posterior, above cervical os
P. Cord Insertion:  Marginal insertion
Amniotic Fluid
AFI FV:      Subjectively within normal limits

AFI Sum(cm)     %Tile       Largest Pocket(cm)
10.04           19

RUQ(cm)                     LUQ(cm)        LLQ(cm)
4.54
Biophysical Evaluation

Amniotic F.V:   Within normal limits       F. Tone:        Observed
F. Movement:    Observed                   Score:          [DATE]
F. Breathing:   Observed
Biometry

BPD:      83.2  mm     G. Age:  33w 3d         32  %    CI:        73.19   %    70 - 86
FL/HC:      20.0   %    19.4 -
HC:      309.1  mm     G. Age:  34w 4d         26  %    HC/AC:      1.06        0.96 -
AC:      290.7  mm     G. Age:  33w 0d         27  %    FL/BPD:     74.3   %    71 - 87
FL:       61.8  mm     G. Age:  32w 0d          6  %    FL/AC:      21.3   %    20 - 24
HUM:        57  mm     G. Age:  33w 1d         39  %

Est. FW:    8209  gm    4 lb 10 oz      39  %
Gestational Age

LMP:           34w 0d        Date:  02/19/17                 EDD:   11/26/17
U/S Today:     33w 2d                                        EDD:   12/01/17
Best:          34w 0d     Det. By:  LMP  (02/19/17)          EDD:   11/26/17
Anatomy

Cranium:               Appears normal         Aortic Arch:            Previously seen
Cavum:                 Previously seen        Ductal Arch:            Previously seen
Ventricles:            Appears normal         Diaphragm:              Appears normal
Choroid Plexus:        Appears normal         Stomach:                Appears normal, left
sided
Cerebellum:            Previously seen        Abdomen:                Appears normal
Posterior Fossa:       Previously seen        Abdominal Wall:         Previously seen
Nuchal Fold:           Previously seen        Cord Vessels:           Appears normal (3
vessel cord)
Face:                  Appears normal         Kidneys:                Appear normal
(orbits and profile)
Lips:                  Appears normal         Bladder:                Appears normal
Thoracic:              Appears normal         Spine:                  Limited views
previously seen
Heart:                 Appears normal         Upper Extremities:      Previously seen
(4CH, axis, and situs
RVOT:                  Previously seen        Lower Extremities:      Previously seen
LVOT:                  Appears normal

Other:  Female gender previously seen.  Heels previously seen. Technically
difficult due to advanced gestational age.
Doppler - Fetal Vessels

Umbilical Artery
S/D     %tile                                            ADFV    RDFV
3       72                                                No      No
Cervix Uterus Adnexa

Cervix
Not visualized (advanced GA >07wks)

Uterus
No abnormality visualized.

Left Ovary
Not visualized.

Right Ovary
Not visualized.

Cul De Sac:   No free fluid seen.

Adnexa:       No abnormality visualized.
Impression

SIUP at 34+0 weeks
Normal interval anatomy; anatomic survey complete
Normal amniotic fluid volume
Appropriate interval growth with EFW at the 39th %tile; AC at
the 27th %tile
BPP [DATE]
UA dopplers were normal for this GA
Recommendations

Continue weekly BPPs for cHTN

## 2020-07-08 DIAGNOSIS — N390 Urinary tract infection, site not specified: Secondary | ICD-10-CM | POA: Diagnosis not present

## 2020-07-08 DIAGNOSIS — Z114 Encounter for screening for human immunodeficiency virus [HIV]: Secondary | ICD-10-CM | POA: Diagnosis not present

## 2020-07-08 DIAGNOSIS — Z113 Encounter for screening for infections with a predominantly sexual mode of transmission: Secondary | ICD-10-CM | POA: Diagnosis not present

## 2020-10-09 DIAGNOSIS — Z3200 Encounter for pregnancy test, result unknown: Secondary | ICD-10-CM | POA: Diagnosis not present

## 2020-10-09 DIAGNOSIS — Z3009 Encounter for other general counseling and advice on contraception: Secondary | ICD-10-CM | POA: Diagnosis not present

## 2020-11-17 ENCOUNTER — Encounter (HOSPITAL_COMMUNITY): Payer: Self-pay | Admitting: Emergency Medicine

## 2020-11-17 ENCOUNTER — Emergency Department (HOSPITAL_COMMUNITY)
Admission: EM | Admit: 2020-11-17 | Discharge: 2020-11-17 | Disposition: A | Discharge status: Home / Self Care | Payer: Medicaid Other | Attending: Emergency Medicine | Admitting: Emergency Medicine

## 2020-11-17 ENCOUNTER — Other Ambulatory Visit: Payer: Self-pay

## 2020-11-17 ENCOUNTER — Emergency Department (HOSPITAL_COMMUNITY): Payer: Medicaid Other

## 2020-11-17 DIAGNOSIS — I10 Essential (primary) hypertension: Secondary | ICD-10-CM | POA: Diagnosis not present

## 2020-11-17 DIAGNOSIS — S3992XA Unspecified injury of lower back, initial encounter: Secondary | ICD-10-CM

## 2020-11-17 DIAGNOSIS — S299XXA Unspecified injury of thorax, initial encounter: Secondary | ICD-10-CM | POA: Insufficient documentation

## 2020-11-17 DIAGNOSIS — M545 Low back pain, unspecified: Secondary | ICD-10-CM | POA: Diagnosis not present

## 2020-11-17 DIAGNOSIS — M546 Pain in thoracic spine: Secondary | ICD-10-CM | POA: Diagnosis not present

## 2020-11-17 DIAGNOSIS — Y92410 Unspecified street and highway as the place of occurrence of the external cause: Secondary | ICD-10-CM | POA: Diagnosis not present

## 2020-11-17 LAB — I-STAT BETA HCG BLOOD, ED (MC, WL, AP ONLY): I-stat hCG, quantitative: <5 m[IU]/mL (ref <5)
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
## 2020-11-17 NOTE — ED Notes (Signed)
An After Visit Summary was printed and given to the patient. Discharge instructions given and no further questions at this time.  

## 2020-11-18 ENCOUNTER — Telehealth: Payer: Self-pay

## 2020-11-18 NOTE — Telephone Encounter (Signed)
Transition Care Management Follow-up Telephone Call  Date of discharge and from where: 11/17/2020 from [facility]  How have you been since you were released from the hospital? Pt states that she is feeling okay and that she is stiff and sore but able to get around.   Any questions or concerns? No  Items Reviewed:  Did the pt receive and understand the discharge instructions provided? Yes   Medications obtained and verified? Yes   Other? No   Any new allergies since your discharge? No   Dietary orders reviewed? N/A  Do you have support at home? Yes   Functional Questionnaire: (I = Independent and D = Dependent) ADLs: I  Bathing/Dressing- I  Meal Prep- I  Eating- I  Maintaining continence- I  Transferring/Ambulation- I  Managing Meds- I   Follow up appointments reviewed:   PCP Hospital f/u appt confirmed? No    Specialist Hospital f/u appt confirmed? No    Are transportation arrangements needed? No   If their condition worsens, is the pt aware to call PCP or go to the Emergency Dept.? Yes  Was the patient provided with contact information for the PCP's office or ED? Yes  Was to pt encouraged to call back with questions or concerns? Yes

## 2020-12-20 DIAGNOSIS — Z114 Encounter for screening for human immunodeficiency virus [HIV]: Secondary | ICD-10-CM | POA: Diagnosis not present

## 2020-12-20 DIAGNOSIS — A5901 Trichomonal vulvovaginitis: Secondary | ICD-10-CM | POA: Diagnosis not present

## 2020-12-20 DIAGNOSIS — Z113 Encounter for screening for infections with a predominantly sexual mode of transmission: Secondary | ICD-10-CM | POA: Diagnosis not present

## 2020-12-24 DIAGNOSIS — A515 Early syphilis, latent: Secondary | ICD-10-CM | POA: Diagnosis not present

## 2021-01-17 DIAGNOSIS — Z113 Encounter for screening for infections with a predominantly sexual mode of transmission: Secondary | ICD-10-CM | POA: Diagnosis not present

## 2021-04-15 DIAGNOSIS — Z113 Encounter for screening for infections with a predominantly sexual mode of transmission: Secondary | ICD-10-CM | POA: Diagnosis not present

## 2021-04-15 DIAGNOSIS — Z114 Encounter for screening for human immunodeficiency virus [HIV]: Secondary | ICD-10-CM | POA: Diagnosis not present

## 2021-08-19 DIAGNOSIS — N760 Acute vaginitis: Secondary | ICD-10-CM | POA: Diagnosis not present

## 2021-08-19 DIAGNOSIS — Z113 Encounter for screening for infections with a predominantly sexual mode of transmission: Secondary | ICD-10-CM | POA: Diagnosis not present

## 2021-08-19 DIAGNOSIS — Z114 Encounter for screening for human immunodeficiency virus [HIV]: Secondary | ICD-10-CM | POA: Diagnosis not present

## 2022-06-04 DIAGNOSIS — Z114 Encounter for screening for human immunodeficiency virus [HIV]: Secondary | ICD-10-CM | POA: Diagnosis not present

## 2022-06-04 DIAGNOSIS — Z202 Contact with and (suspected) exposure to infections with a predominantly sexual mode of transmission: Secondary | ICD-10-CM | POA: Diagnosis not present

## 2022-06-04 DIAGNOSIS — Z113 Encounter for screening for infections with a predominantly sexual mode of transmission: Secondary | ICD-10-CM | POA: Diagnosis not present

## 2022-06-04 DIAGNOSIS — R030 Elevated blood-pressure reading, without diagnosis of hypertension: Secondary | ICD-10-CM | POA: Diagnosis not present
